# Patient Record
Sex: Female | Born: 1970 | Race: White | Hispanic: No | Marital: Single | Smoking: Never smoker
Health system: Southern US, Community
[De-identification: ages and names within clinical notes are randomized; demographics above are authoritative.]

## PROBLEM LIST (undated history)

## (undated) DIAGNOSIS — M3500 Sicca syndrome, unspecified: Secondary | ICD-10-CM

## (undated) DIAGNOSIS — K219 Gastro-esophageal reflux disease without esophagitis: Secondary | ICD-10-CM

## (undated) DIAGNOSIS — C801 Malignant (primary) neoplasm, unspecified: Secondary | ICD-10-CM

## (undated) DIAGNOSIS — M069 Rheumatoid arthritis, unspecified: Secondary | ICD-10-CM

## (undated) DIAGNOSIS — E079 Disorder of thyroid, unspecified: Secondary | ICD-10-CM

## (undated) DIAGNOSIS — I10 Essential (primary) hypertension: Secondary | ICD-10-CM

## (undated) DIAGNOSIS — K900 Celiac disease: Secondary | ICD-10-CM

## (undated) HISTORY — PX: PELVIC LYMPH NODE DISSECTION: SHX6543

## (undated) HISTORY — PX: ABDOMINAL HYSTERECTOMY: SHX81

---

## 2006-02-20 ENCOUNTER — Encounter (INDEPENDENT_AMBULATORY_CARE_PROVIDER_SITE_OTHER): Payer: Self-pay | Admitting: Family Medicine

## 2006-07-19 ENCOUNTER — Emergency Department (HOSPITAL_COMMUNITY): Admission: EM | Admit: 2006-07-19 | Discharge: 2006-07-19 | Payer: Self-pay | Admitting: Emergency Medicine

## 2007-02-05 ENCOUNTER — Emergency Department (HOSPITAL_COMMUNITY): Admission: EM | Admit: 2007-02-05 | Discharge: 2007-02-05 | Payer: Self-pay | Admitting: Emergency Medicine

## 2007-02-21 ENCOUNTER — Encounter (INDEPENDENT_AMBULATORY_CARE_PROVIDER_SITE_OTHER): Payer: Self-pay | Admitting: Family Medicine

## 2007-05-13 ENCOUNTER — Other Ambulatory Visit: Admission: RE | Admit: 2007-05-13 | Discharge: 2007-05-13 | Payer: Self-pay | Admitting: Obstetrics and Gynecology

## 2007-05-13 ENCOUNTER — Encounter (INDEPENDENT_AMBULATORY_CARE_PROVIDER_SITE_OTHER): Payer: Self-pay | Admitting: Family Medicine

## 2007-05-16 ENCOUNTER — Ambulatory Visit (HOSPITAL_COMMUNITY): Admission: RE | Admit: 2007-05-16 | Discharge: 2007-05-16 | Payer: Self-pay | Admitting: Obstetrics & Gynecology

## 2007-05-19 ENCOUNTER — Ambulatory Visit: Payer: Self-pay | Admitting: Family Medicine

## 2007-05-19 DIAGNOSIS — M129 Arthropathy, unspecified: Secondary | ICD-10-CM | POA: Insufficient documentation

## 2007-05-19 DIAGNOSIS — K5900 Constipation, unspecified: Secondary | ICD-10-CM | POA: Insufficient documentation

## 2007-05-19 DIAGNOSIS — R599 Enlarged lymph nodes, unspecified: Secondary | ICD-10-CM | POA: Insufficient documentation

## 2007-05-19 DIAGNOSIS — R799 Abnormal finding of blood chemistry, unspecified: Secondary | ICD-10-CM | POA: Insufficient documentation

## 2007-05-19 DIAGNOSIS — E785 Hyperlipidemia, unspecified: Secondary | ICD-10-CM

## 2007-05-19 DIAGNOSIS — F329 Major depressive disorder, single episode, unspecified: Secondary | ICD-10-CM | POA: Insufficient documentation

## 2007-05-19 DIAGNOSIS — K219 Gastro-esophageal reflux disease without esophagitis: Secondary | ICD-10-CM | POA: Insufficient documentation

## 2007-05-19 DIAGNOSIS — R5383 Other fatigue: Secondary | ICD-10-CM

## 2007-05-19 DIAGNOSIS — N63 Unspecified lump in unspecified breast: Secondary | ICD-10-CM | POA: Insufficient documentation

## 2007-05-19 DIAGNOSIS — G43909 Migraine, unspecified, not intractable, without status migrainosus: Secondary | ICD-10-CM | POA: Insufficient documentation

## 2007-05-19 DIAGNOSIS — R5381 Other malaise: Secondary | ICD-10-CM | POA: Insufficient documentation

## 2007-05-21 ENCOUNTER — Other Ambulatory Visit: Admission: RE | Admit: 2007-05-21 | Discharge: 2007-05-21 | Payer: Self-pay | Admitting: Obstetrics & Gynecology

## 2007-05-21 ENCOUNTER — Encounter (INDEPENDENT_AMBULATORY_CARE_PROVIDER_SITE_OTHER): Payer: Self-pay | Admitting: Family Medicine

## 2007-05-21 ENCOUNTER — Ambulatory Visit (HOSPITAL_COMMUNITY): Admission: RE | Admit: 2007-05-21 | Discharge: 2007-05-21 | Payer: Self-pay | Admitting: Obstetrics & Gynecology

## 2007-05-22 LAB — CONVERTED CEMR LAB
Albumin: 4.6 g/dL (ref 3.5–5.2)
Basophils Relative: 1 % (ref 0–1)
Calcium: 9.5 mg/dL (ref 8.4–10.5)
Cholesterol: 178 mg/dL (ref 0–200)
Creatinine, Ser: 0.81 mg/dL (ref 0.40–1.20)
Eosinophils Absolute: 0.1 10*3/uL (ref 0.0–0.7)
Glucose, Bld: 91 mg/dL (ref 70–99)
HDL: 41 mg/dL (ref >39)
Hemoglobin: 14.1 g/dL (ref 12.0–15.0)
Lymphocytes Relative: 30 % (ref 12–46)
Lymphs Abs: 1.5 10*3/uL (ref 0.7–3.3)
Monocytes Absolute: 0.5 10*3/uL (ref 0.2–0.7)
Neutro Abs: 3 10*3/uL (ref 1.7–7.7)
Neutrophils Relative %: 58 % (ref 43–77)
RDW: 14.1 % — ABNORMAL HIGH (ref 11.5–14.0)
Rhuematoid fact SerPl-aCnc: 23 intl units/mL — ABNORMAL HIGH (ref 0–20)
Total CHOL/HDL Ratio: 4.3
Total Protein: 8.2 g/dL (ref 6.0–8.3)
Triglycerides: 170 mg/dL — ABNORMAL HIGH (ref <150)
WBC: 5.1 10*3/uL (ref 4.0–10.5)

## 2007-05-23 ENCOUNTER — Encounter (INDEPENDENT_AMBULATORY_CARE_PROVIDER_SITE_OTHER): Payer: Self-pay | Admitting: Family Medicine

## 2007-05-26 ENCOUNTER — Encounter (INDEPENDENT_AMBULATORY_CARE_PROVIDER_SITE_OTHER): Payer: Self-pay | Admitting: Family Medicine

## 2007-05-26 ENCOUNTER — Telehealth (INDEPENDENT_AMBULATORY_CARE_PROVIDER_SITE_OTHER): Payer: Self-pay | Admitting: *Deleted

## 2007-05-27 ENCOUNTER — Ambulatory Visit (HOSPITAL_COMMUNITY): Admission: RE | Admit: 2007-05-27 | Discharge: 2007-05-27 | Payer: Self-pay | Admitting: Family Medicine

## 2007-06-02 ENCOUNTER — Ambulatory Visit: Payer: Self-pay | Admitting: Family Medicine

## 2007-06-02 DIAGNOSIS — IMO0001 Reserved for inherently not codable concepts without codable children: Secondary | ICD-10-CM

## 2007-06-02 DIAGNOSIS — K117 Disturbances of salivary secretion: Secondary | ICD-10-CM

## 2007-06-02 LAB — CONVERTED CEMR LAB
Cholesterol, target level: 200 mg/dL
LDL Goal: 160 mg/dL

## 2007-06-03 ENCOUNTER — Telehealth (INDEPENDENT_AMBULATORY_CARE_PROVIDER_SITE_OTHER): Payer: Self-pay | Admitting: *Deleted

## 2007-06-03 ENCOUNTER — Encounter (INDEPENDENT_AMBULATORY_CARE_PROVIDER_SITE_OTHER): Payer: Self-pay | Admitting: Family Medicine

## 2007-06-03 DIAGNOSIS — K149 Disease of tongue, unspecified: Secondary | ICD-10-CM | POA: Insufficient documentation

## 2007-06-04 ENCOUNTER — Encounter (INDEPENDENT_AMBULATORY_CARE_PROVIDER_SITE_OTHER): Payer: Self-pay | Admitting: Family Medicine

## 2007-06-11 ENCOUNTER — Ambulatory Visit (HOSPITAL_COMMUNITY): Admission: RE | Admit: 2007-06-11 | Discharge: 2007-06-11 | Payer: Self-pay | Admitting: Obstetrics & Gynecology

## 2007-06-11 ENCOUNTER — Encounter: Payer: Self-pay | Admitting: Obstetrics & Gynecology

## 2007-07-16 ENCOUNTER — Ambulatory Visit: Admission: RE | Admit: 2007-07-16 | Discharge: 2007-07-16 | Payer: Self-pay | Admitting: Gynecologic Oncology

## 2007-08-23 ENCOUNTER — Emergency Department (HOSPITAL_COMMUNITY): Admission: EM | Admit: 2007-08-23 | Discharge: 2007-08-23 | Payer: Self-pay | Admitting: Emergency Medicine

## 2007-08-24 ENCOUNTER — Encounter (INDEPENDENT_AMBULATORY_CARE_PROVIDER_SITE_OTHER): Payer: Self-pay | Admitting: Family Medicine

## 2007-08-24 ENCOUNTER — Ambulatory Visit (HOSPITAL_COMMUNITY): Admission: RE | Admit: 2007-08-24 | Discharge: 2007-08-24 | Payer: Self-pay | Admitting: Emergency Medicine

## 2007-08-29 ENCOUNTER — Emergency Department (HOSPITAL_COMMUNITY): Admission: EM | Admit: 2007-08-29 | Discharge: 2007-08-29 | Payer: Self-pay | Admitting: Emergency Medicine

## 2007-08-30 ENCOUNTER — Encounter (INDEPENDENT_AMBULATORY_CARE_PROVIDER_SITE_OTHER): Payer: Self-pay | Admitting: Family Medicine

## 2007-09-27 ENCOUNTER — Emergency Department (HOSPITAL_COMMUNITY): Admission: EM | Admit: 2007-09-27 | Discharge: 2007-09-27 | Payer: Self-pay | Admitting: Emergency Medicine

## 2007-10-01 ENCOUNTER — Ambulatory Visit (HOSPITAL_COMMUNITY): Admission: RE | Admit: 2007-10-01 | Discharge: 2007-10-01 | Payer: Self-pay | Admitting: Gastroenterology

## 2007-10-01 ENCOUNTER — Ambulatory Visit: Payer: Self-pay | Admitting: Gastroenterology

## 2007-11-24 ENCOUNTER — Ambulatory Visit (HOSPITAL_COMMUNITY): Admission: RE | Admit: 2007-11-24 | Discharge: 2007-11-24 | Payer: Self-pay | Admitting: Obstetrics and Gynecology

## 2008-05-15 ENCOUNTER — Emergency Department (HOSPITAL_COMMUNITY): Admission: EM | Admit: 2008-05-15 | Discharge: 2008-05-15 | Payer: Self-pay | Admitting: Emergency Medicine

## 2008-05-27 ENCOUNTER — Emergency Department (HOSPITAL_COMMUNITY): Admission: EM | Admit: 2008-05-27 | Discharge: 2008-05-27 | Payer: Self-pay | Admitting: Emergency Medicine

## 2008-06-24 ENCOUNTER — Encounter: Admission: RE | Admit: 2008-06-24 | Discharge: 2008-06-24 | Payer: Self-pay | Admitting: Family Medicine

## 2008-07-07 ENCOUNTER — Emergency Department (HOSPITAL_COMMUNITY): Admission: EM | Admit: 2008-07-07 | Discharge: 2008-07-07 | Payer: Self-pay | Admitting: Emergency Medicine

## 2008-09-21 ENCOUNTER — Other Ambulatory Visit: Admission: RE | Admit: 2008-09-21 | Discharge: 2008-09-21 | Payer: Self-pay | Admitting: Gynecologic Oncology

## 2008-09-21 ENCOUNTER — Ambulatory Visit: Admission: RE | Admit: 2008-09-21 | Discharge: 2008-09-21 | Payer: Self-pay | Admitting: Gynecologic Oncology

## 2008-09-21 ENCOUNTER — Encounter: Payer: Self-pay | Admitting: Gynecologic Oncology

## 2008-09-21 ENCOUNTER — Ambulatory Visit (HOSPITAL_COMMUNITY): Admission: RE | Admit: 2008-09-21 | Discharge: 2008-09-21 | Payer: Self-pay | Admitting: Gynecologic Oncology

## 2008-10-05 IMAGING — US US BREAST BILAT
1 series · 9 of 9 positions shown · non-contrast
Comparison: none

BILATERAL DIAGNOSTIC MAMMOGRAM

BILATERAL BREAST ULTRASOUND
BILATERAL DIAGNOSTIC MAMMOGRAM AND BILATERAL BREAST ULTRASOUND:
CLINICAL DATA: There is a dense fibroglandular pattern.  The right breast is negative.  Spot tangential view of 
the area of clinical concern reveals normal tissue.  In the medial aspect of the left breast there 
is an obscured mass that persists on the spot compression views.

[Series 1: us breast bilat · 0.09mm/px · 9 of 9 slices shown]
[im 1/9]
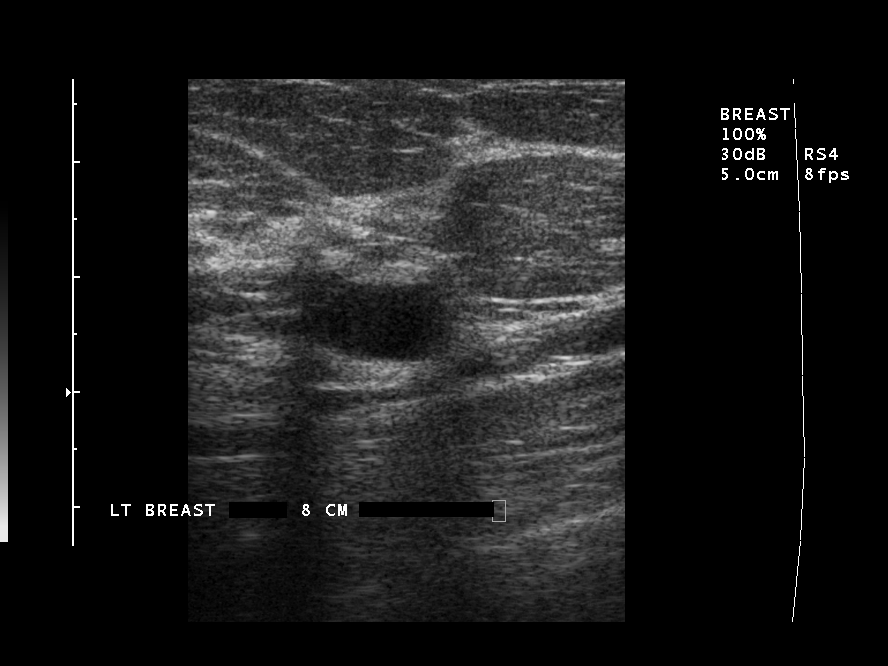
[im 2/9]
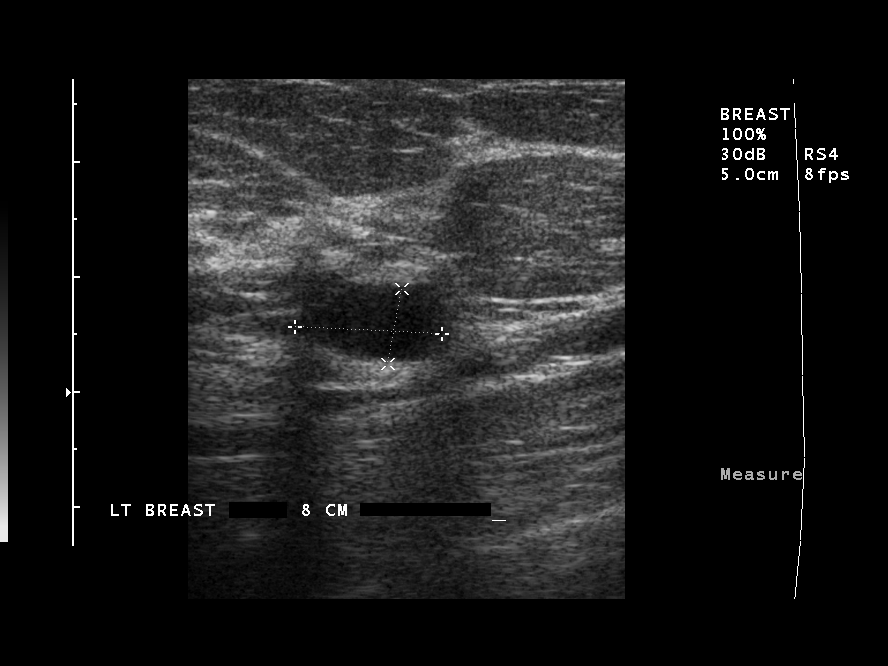
[im 3/9]
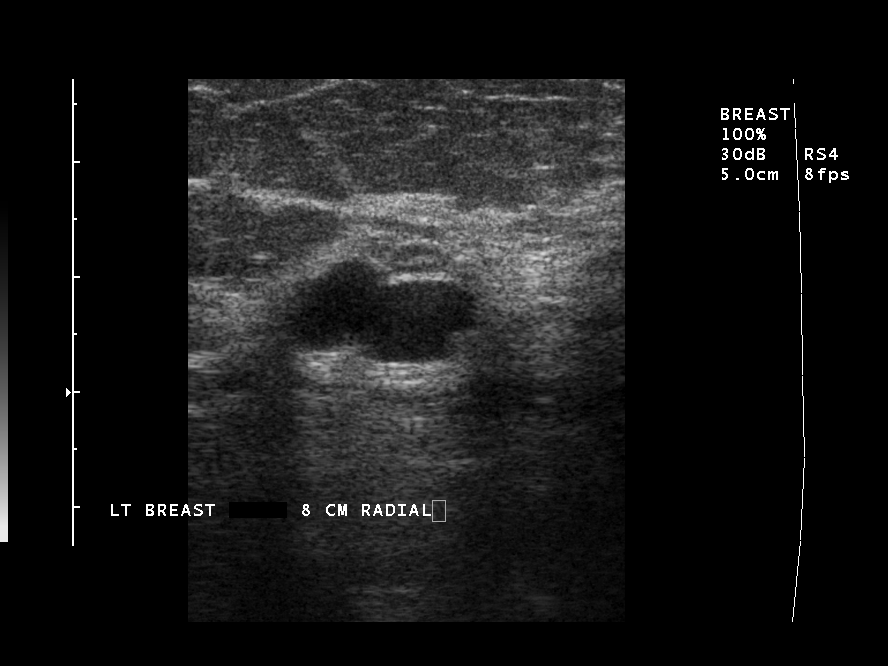
[im 4/9]
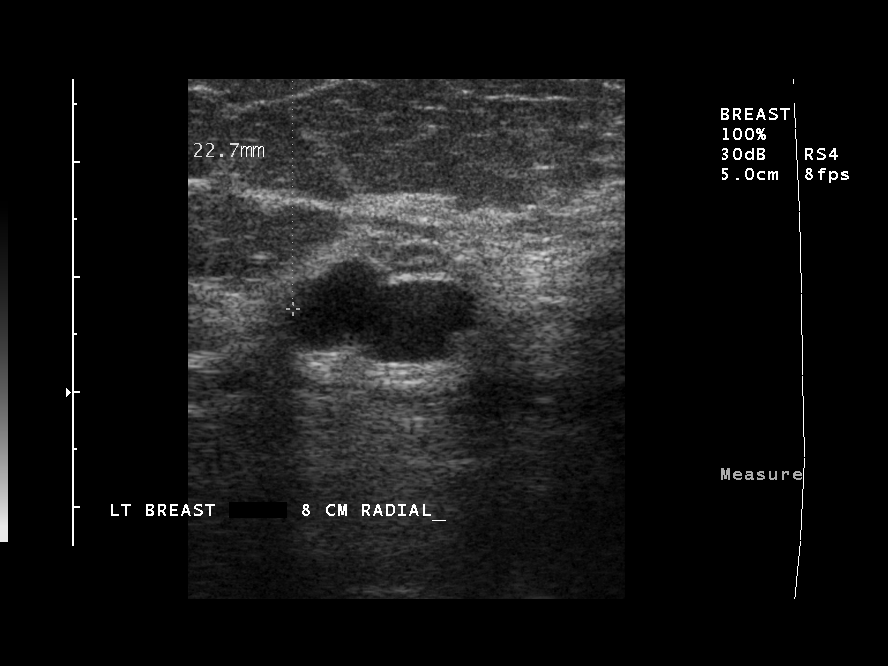
[im 5/9]
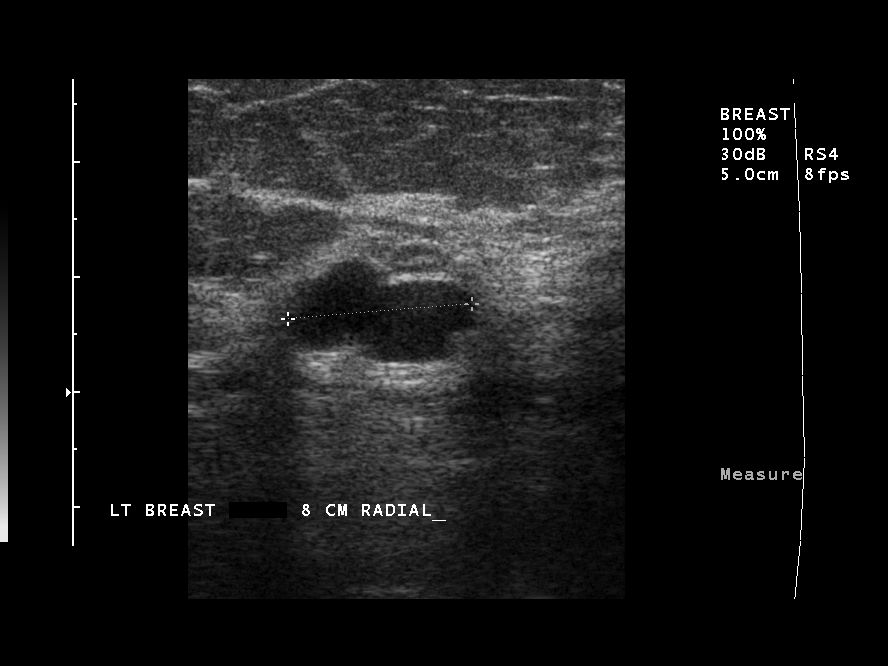
[im 6/9]
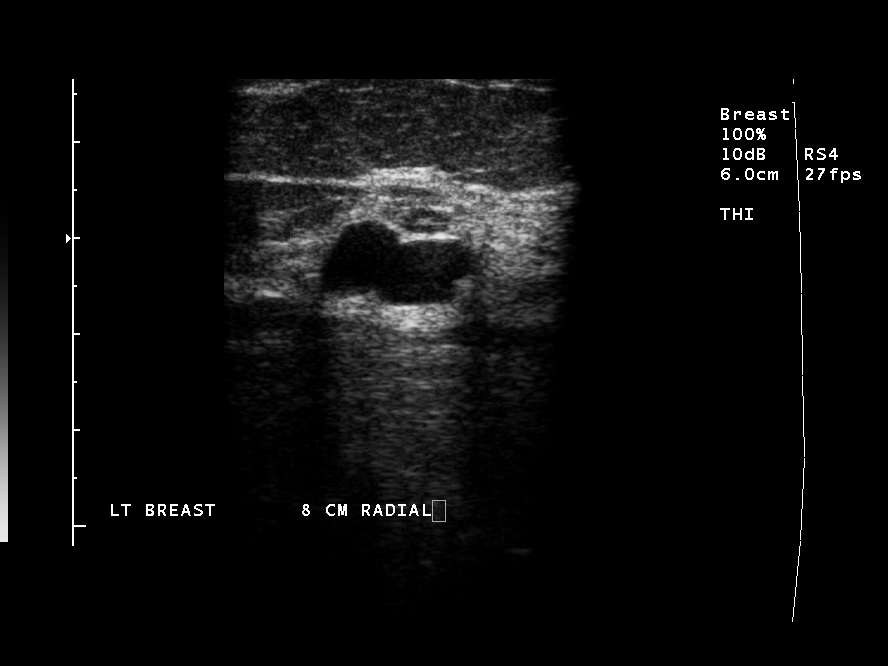
[im 7/9]
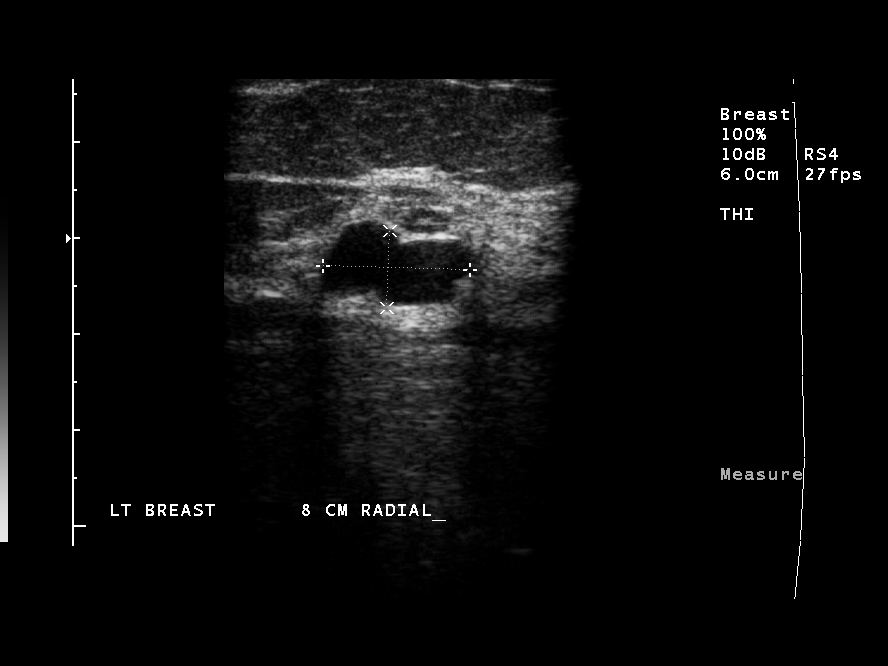
[im 8/9]
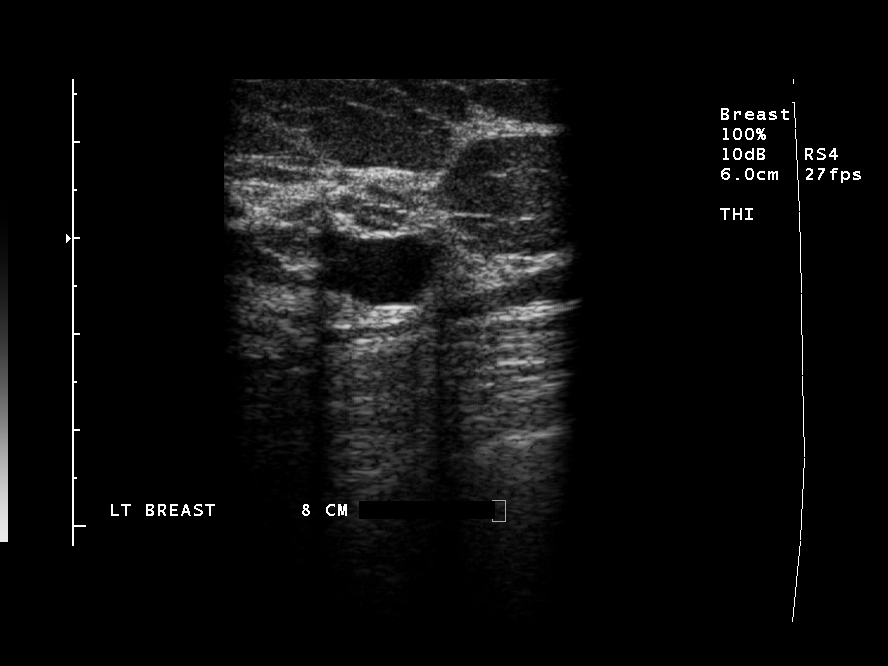
[im 9/9]
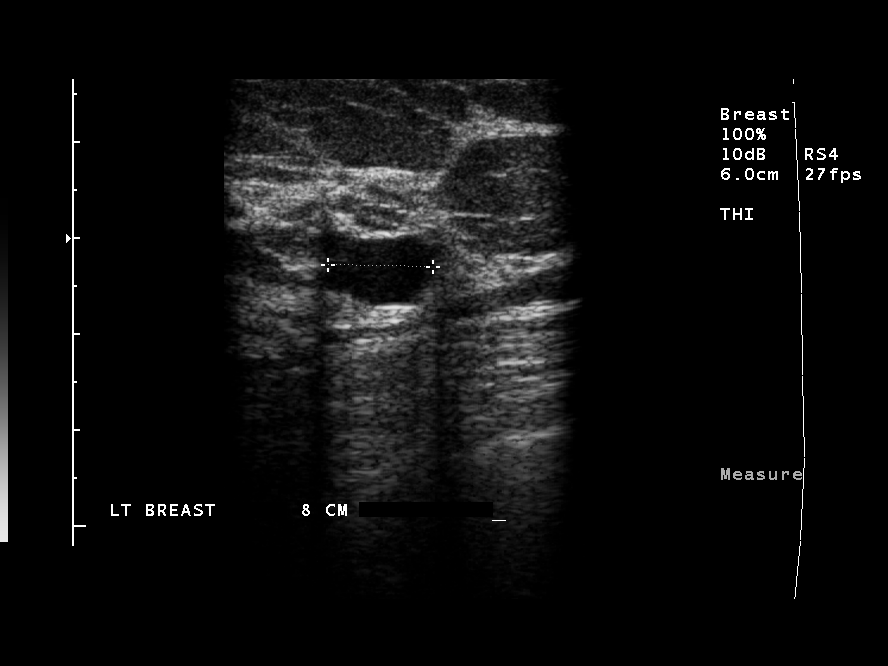

[9 of 9 positions shown; findings below may reference images not displayed]

On physical exam, I do not palpate a mass in the right breast.  Sonographic evaluation of the area 
of clinical concern in the upper inner quadrant reveals normal fibroglandular tissue.  There is no 
solid or cystic mass, abnormal shadowing or distortion.  Sonographic evaluation also of what the 
patient describes is an area of thickening in the lower outer quadrant reveals normal 
fibroglandular tissue.  There is no solid or cystic mass, abnormal shadowing or distortion.

On physical exam, I do not palpate a mass in the left breast.  Sonographically, there is a near 
anechoic mass at 10 o'clock approximately 8 cm from the nipple measuring 13 x 7 x 16 mm.  It is 
felt to likely represent a benign cyst.
IMPRESSION: Probable benign cyst, left breast.  Short term interval follow-up  ultrasound in six months is 
recommended.

ASSESSMENT: Probably benign - BI-RADS 3

Ultrasound of the left breast in 6 months.
,

## 2009-01-08 IMAGING — US US EXTREM LOW VENOUS*L*
1 series · 14 of 24 positions shown · non-contrast
Comparison: No comparison.

CLINICAL DATA: Pain and swelling. 
 LEFT LOWER EXTREMITY VENOUS DOPPLER ULTRASOUND:
TECHNIQUE: Gray-scale sonography with compression, as well as color and duplex Doppler ultrasound, were performed to evaluate the deep venous system from the level of the common femoral vein through the popliteal and proximal calf veins.

[Series 1: unknown · 14 of 33 slices shown]
[im 1/33]
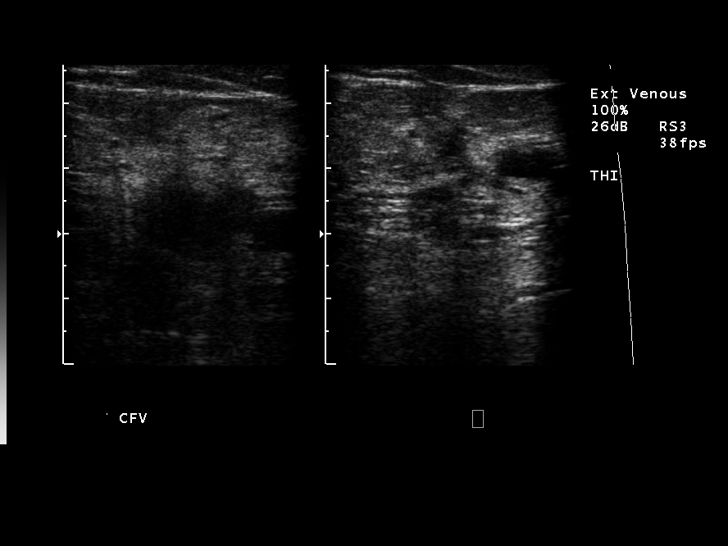
[im 3/33]
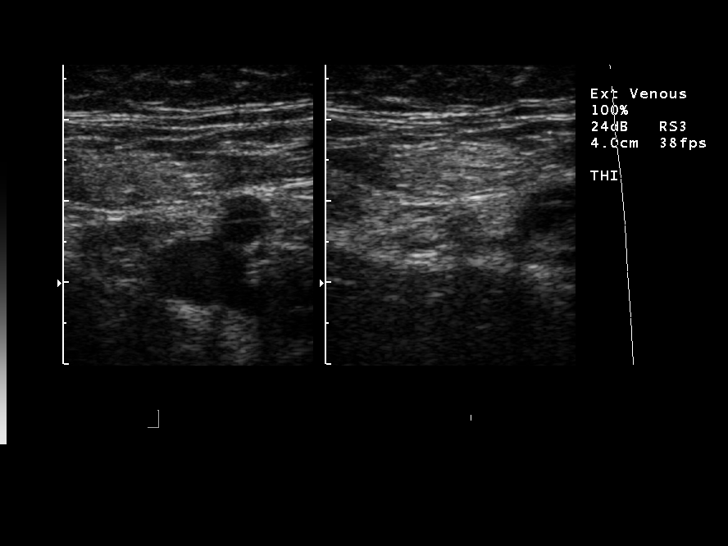
[im 6/33]
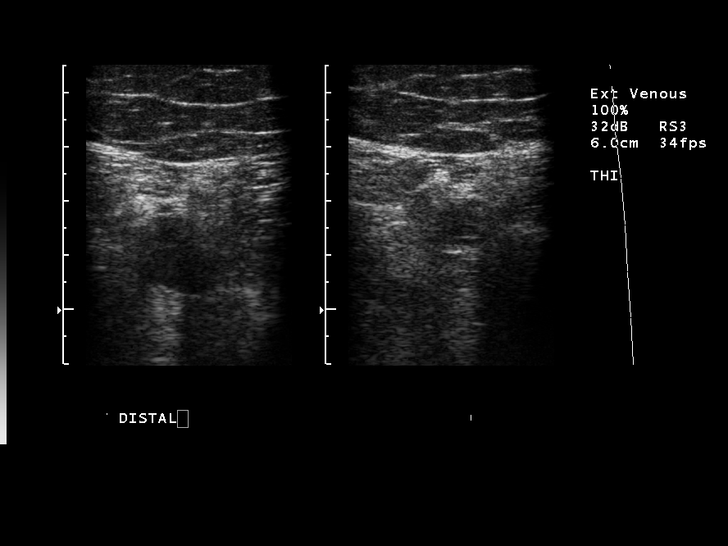
[im 9/33]
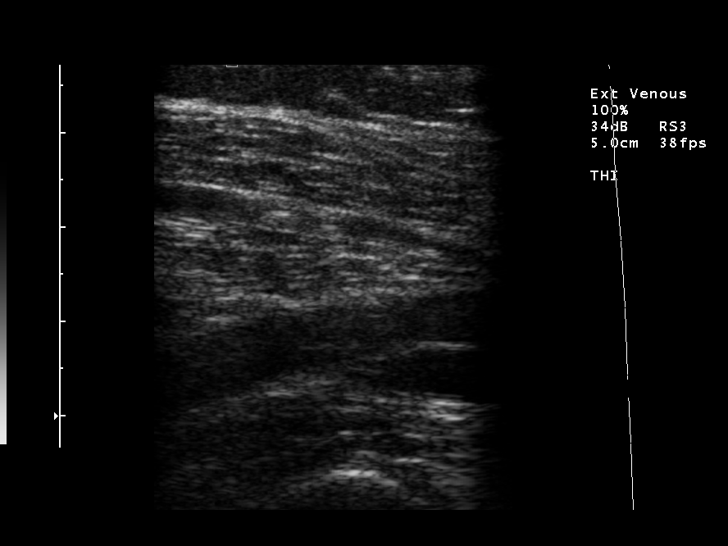
[im 10/33]
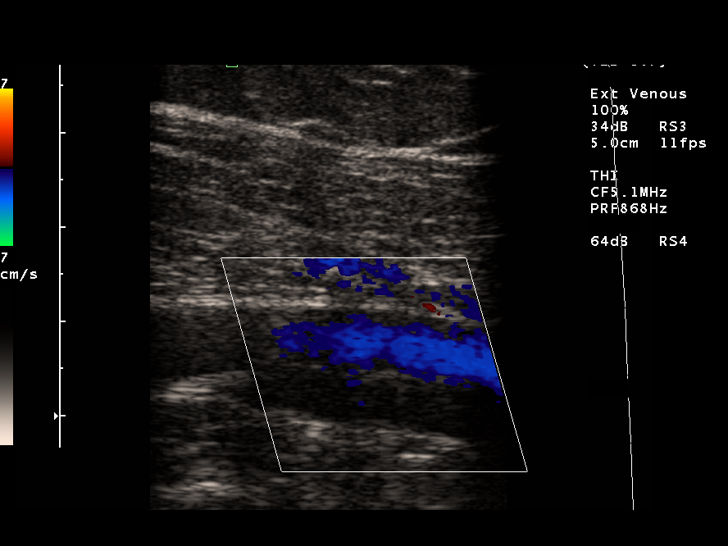
[im 13/33]
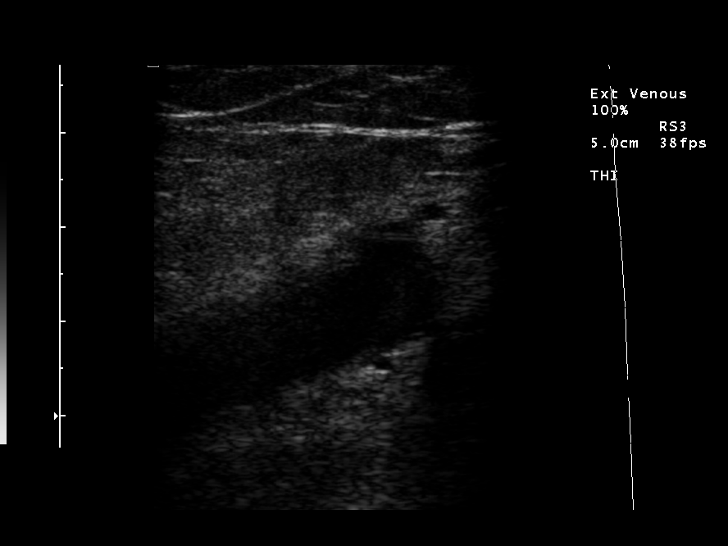
[im 16/33]
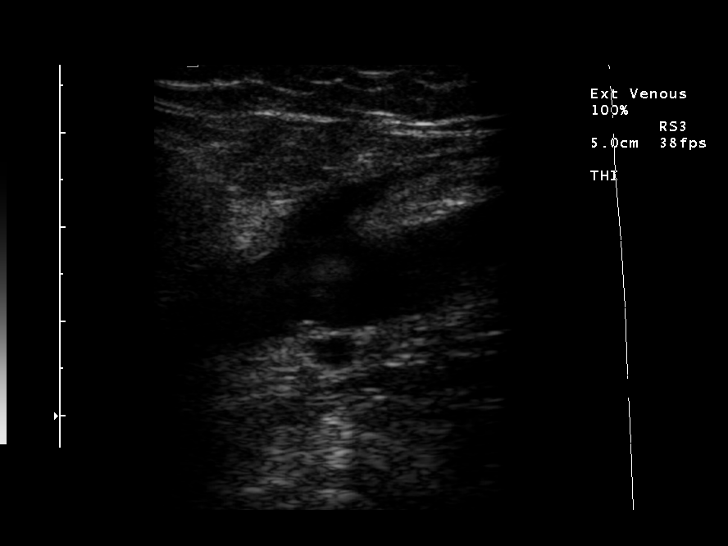
[im 17/33]
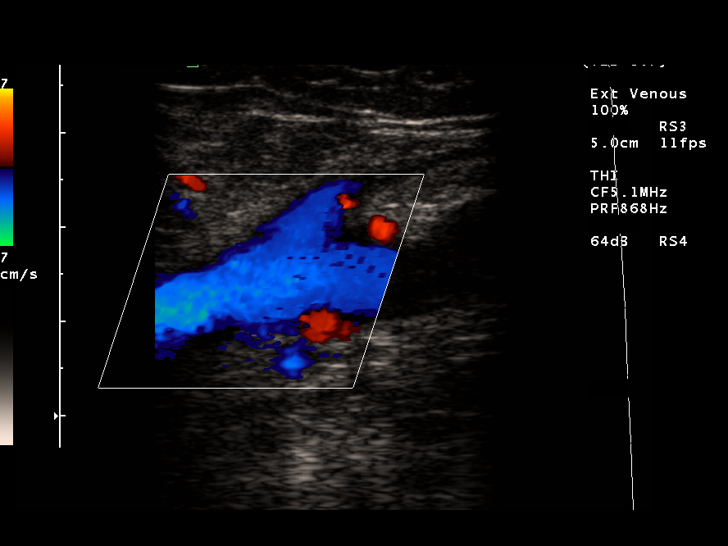
[im 20/33]
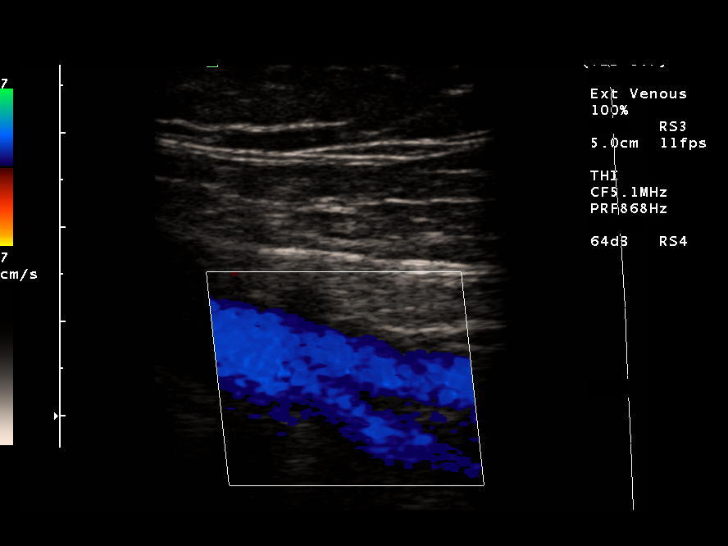
[im 23/33]
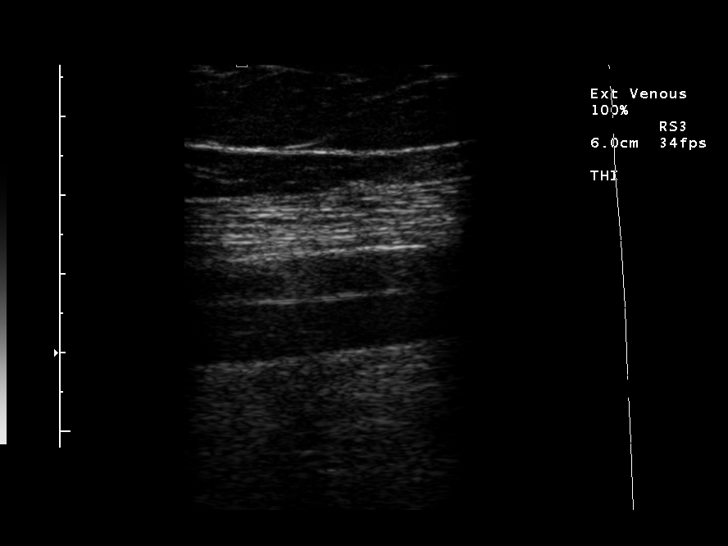
[im 26/33]
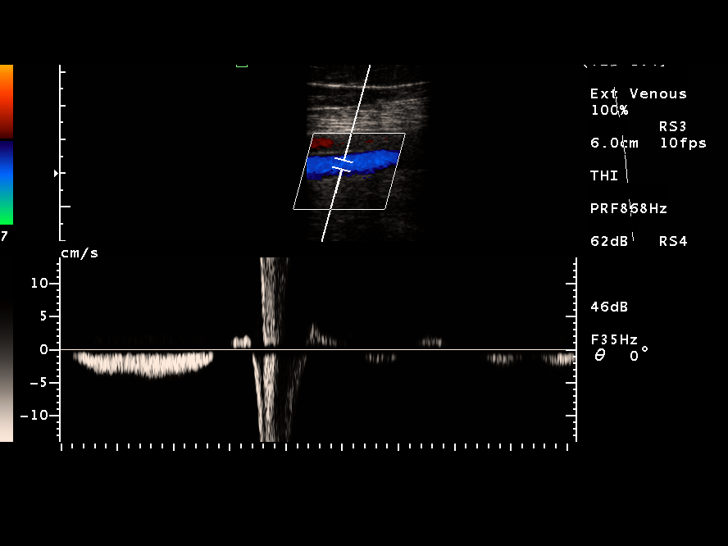
[im 27/33]
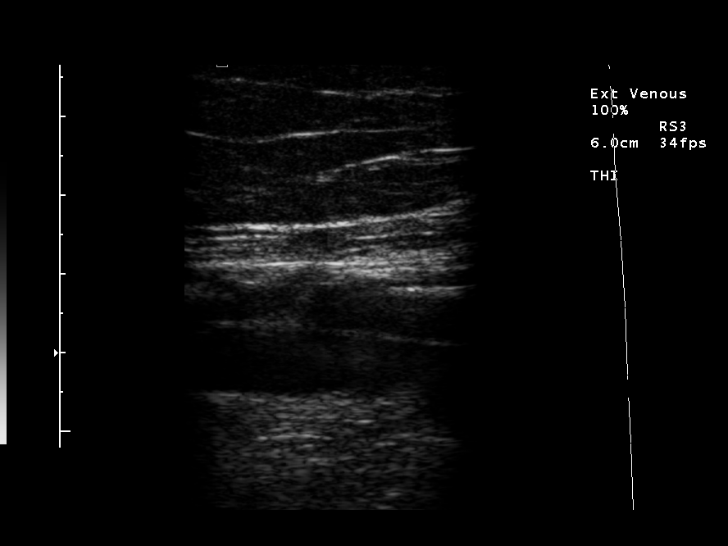
[im 30/33]
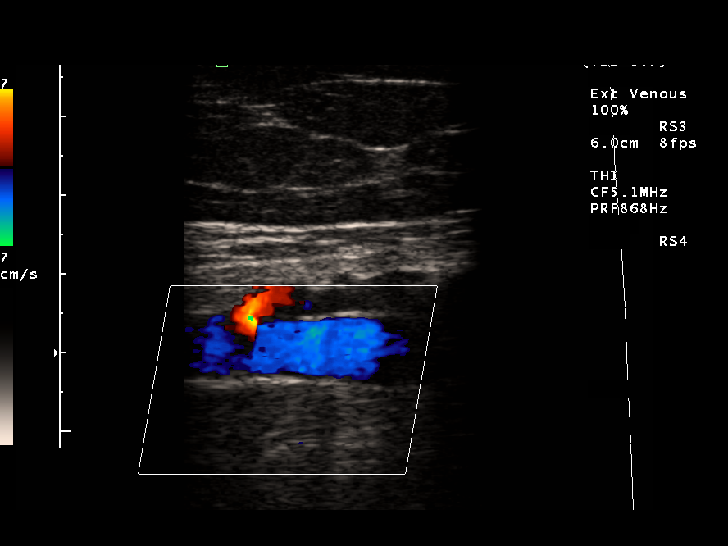
[im 33/33]
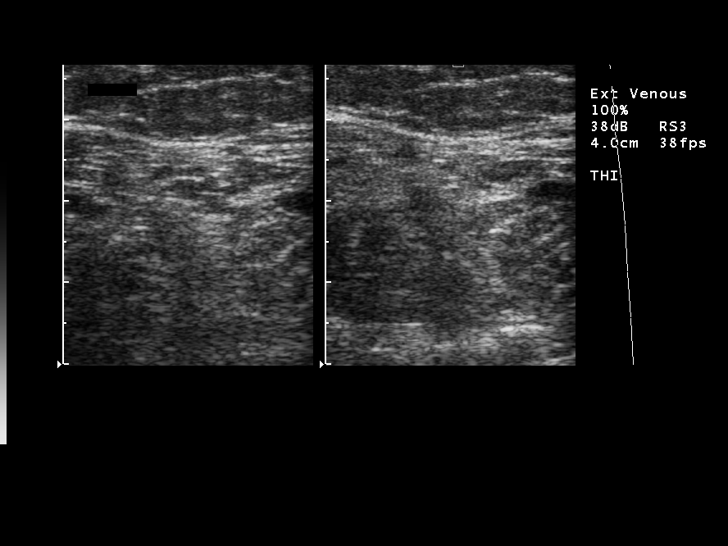

[14 of 24 positions shown; findings below may reference images not displayed]

FINDINGS: Complete compressibility is demonstrated throughout the visualized deep veins.  No venous filling defects are identified by gray-scale or color Doppler sonography.  Doppler waveforms show normal direction of venous flow, with normal phasicity and response to augmentation.
IMPRESSION: Negative.  No evidence of deep venous thrombosis.

## 2009-07-08 ENCOUNTER — Emergency Department (HOSPITAL_COMMUNITY): Admission: EM | Admit: 2009-07-08 | Discharge: 2009-07-08 | Payer: Self-pay | Admitting: Emergency Medicine

## 2009-08-19 ENCOUNTER — Emergency Department (HOSPITAL_COMMUNITY): Admission: EM | Admit: 2009-08-19 | Discharge: 2009-08-19 | Payer: Self-pay | Admitting: Emergency Medicine

## 2009-10-19 ENCOUNTER — Ambulatory Visit (HOSPITAL_COMMUNITY): Admission: RE | Admit: 2009-10-19 | Discharge: 2009-10-19 | Payer: Self-pay | Admitting: Internal Medicine

## 2009-10-24 ENCOUNTER — Ambulatory Visit (HOSPITAL_COMMUNITY): Admission: RE | Admit: 2009-10-24 | Discharge: 2009-10-24 | Payer: Self-pay | Admitting: Internal Medicine

## 2010-09-06 ENCOUNTER — Ambulatory Visit
Admission: RE | Admit: 2010-09-06 | Discharge: 2010-09-06 | Payer: Self-pay | Source: Home / Self Care | Attending: Family Medicine | Admitting: Family Medicine

## 2010-09-17 ENCOUNTER — Encounter: Payer: Self-pay | Admitting: Family Medicine

## 2010-11-23 IMAGING — CR DG CHEST 2V
2 series · 2 of 2 positions shown · non-contrast
Comparison: 09/21/2008

CLINICAL DATA: Sore throat, cough and congestion.

CHEST - 2 VIEW

[view not recorded (1 of 2)]
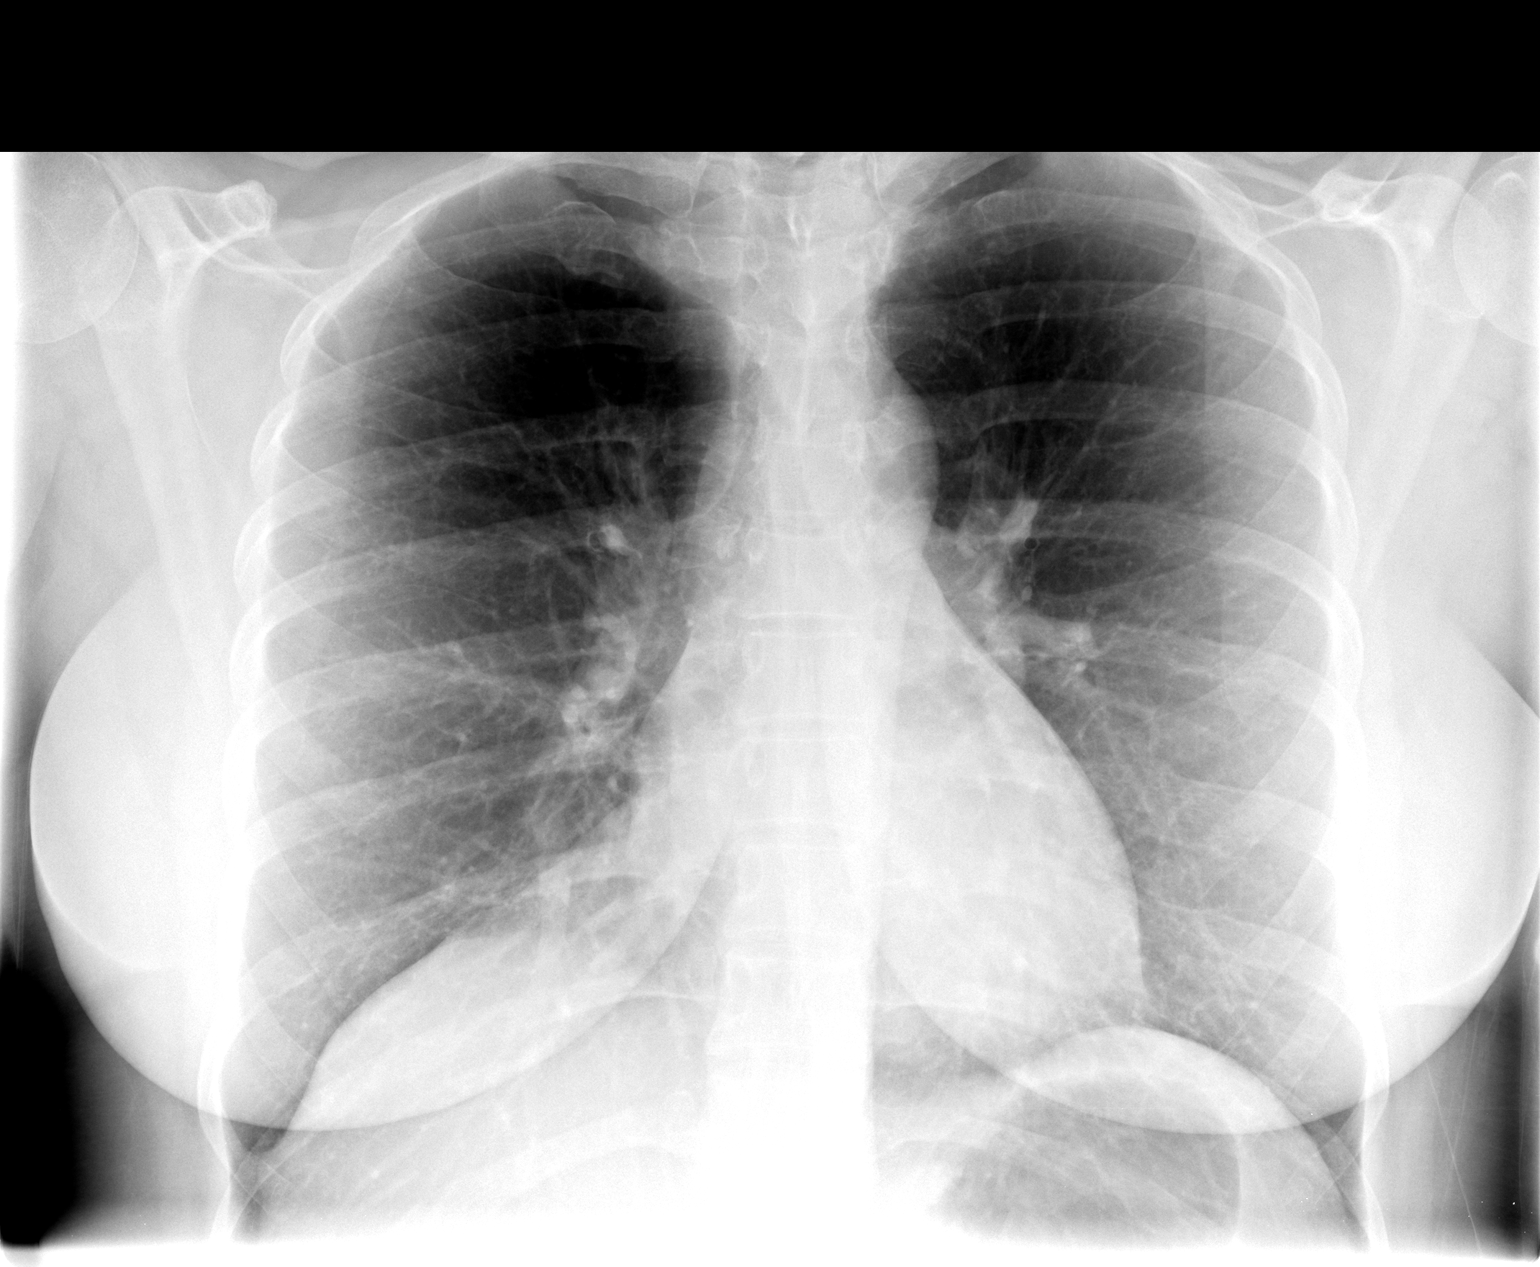

[view not recorded (2 of 2)]
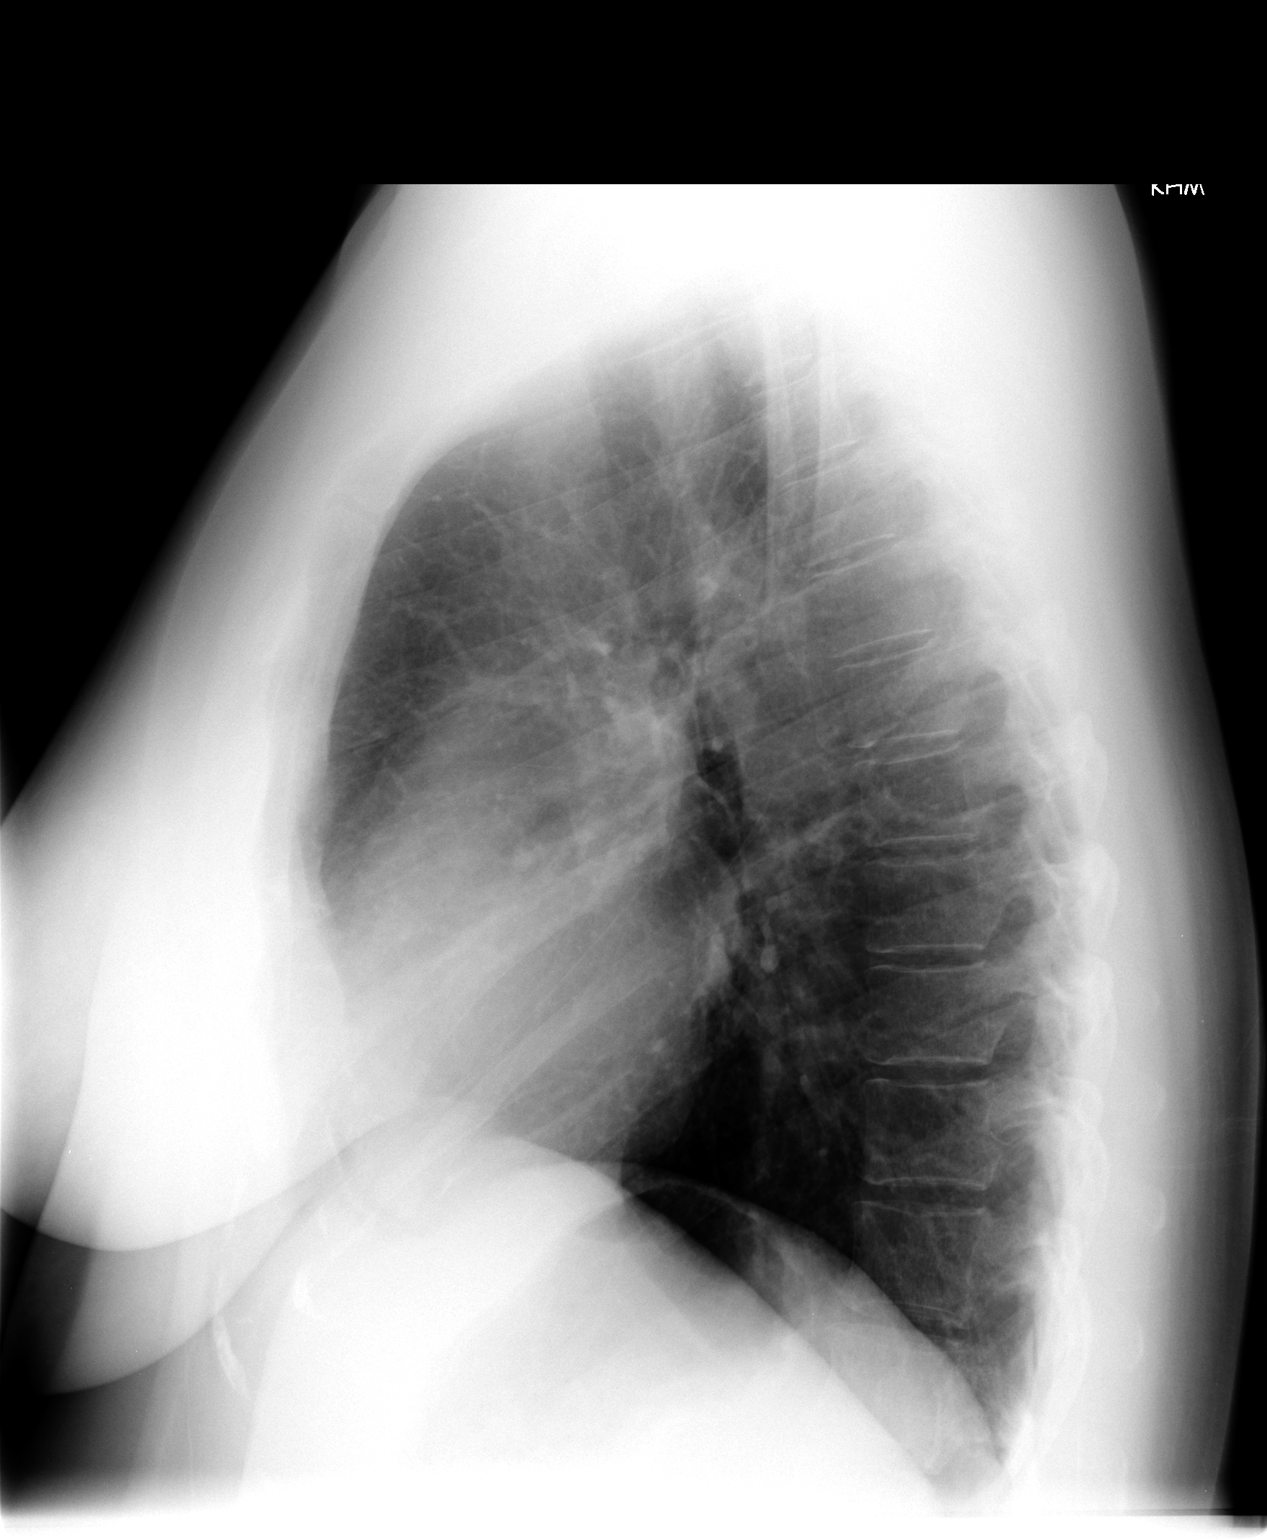

[2 of 2 positions shown; findings below may reference images not displayed]

FINDINGS: The cardiac silhouette, mediastinal and hilar contours
are within normal limits and stable. The lungs are clear.  No
pleural effusions. The bony thorax is intact.
IMPRESSION: No acute cardiopulmonary findings.

## 2010-11-29 LAB — RAPID STREP SCREEN (MED CTR MEBANE ONLY): Streptococcus, Group A Screen (Direct): NEGATIVE

## 2011-01-09 NOTE — Consult Note (Signed)
Alejandra Cole, Alejandra Cole        ACCOUNT NO.:  [id number]   MEDICAL RECORD NO.:  [id number]          PATIENT TYPE:  OUT   LOCATION:  GYN                          FACILITY:  [facility]   PHYSICIAN:  [name], MD    DATE OF BIRTH:  [birth year]   DATE OF CONSULTATION:  09/21/2008  DATE OF DISCHARGE:                                 CONSULTATION   The patient is a 40 year old with history of 1A2 adenocarcinoma of the  cervix who underwent surgical staging December 2008.  She had 0/40 lymph  nodes positive.  She had no residual carcinoma on her hysterectomy  specimen.  She did undergo bilateral oophoropexy.  She was seen by Dr.  Annett Fabian in [place] in July 2009.  Pap smear at that time was  unremarkable.  She comes in today for followup.  She is overall doing  fairly well.  She is complaining of some mid pelvic pain.  She states  that a bowel movement can make the pain come up and it does not awaken  her at night.  It has been about the same and has been fairly  persistent, though it is not exacerbating and seems to be fairly  intermittent with no pattern.  She denies any vaginal bleeding.  She  occasionally has a thin discharge with no itching or burning.  She  states that she might have a diagnosis of fibromyalgia and she takes  ibuprofen for total body pain.  If she has a headache, she takes aspirin  and Tylenol fairly regularly.  She did have an episode where she had a  boil on her left leg that was drained.  She has also developed a rash on  her anterior lower leg.  She is being given steroids for what sounds  like an emergency room visit that really did not make a significant  difference.  There is some pruritus associated with that rash.  She  states that she has had it since her surgery but does not recall exactly  when after the surgery it arose.  She does state that she may have some  lymphedema and she complains of some mild edema around her lateral  malleolus, left  greater than right.   HEALTH MAINTENANCE:  She is up-to-date on her mammograms.   MEDICATIONS:  Ibuprofen and Tylenol as stated above.   PHYSICAL EXAMINATION:  VITAL SIGNS:  Blood pressure 122/74, pulse 72,  respirations 20.  CONSTITUTIONAL:  Well-nourished, alert female in no acute distress.  NECK:  Supple with no lymphadenopathy, no thyromegaly.  LUNGS:  Clear to auscultation bilaterally.  CARDIOVASCULAR:  Exam regular rate and rhythm.  ABDOMEN:  Shows well-healed robotic skin incisions.  Abdomen is soft,  nontender, nondistended.  No palpable masses or past thyromegaly.  Groins are negative for adenopathy.  EXTREMITIES:  There is no edema.  She does have a patch of maculopapular  rash with some small amount of excoriations on the anterior aspect of  her left lower leg.  There is no surrounding erythema.  There is no  induration.  I cannot appreciate any lymphedema.  PELVIC:  External genitalia is  within normal limits.  The vagina is well  epithelialized.  The vaginal cuff is visualized.  No visible lesions.  ThinPrep Pap was submitted without difficulty.  Bimanual examination  reveals no masses or nodularity.  Rectal confirms.   ASSESSMENT:  A 40 year old 1A2 adenocarcinoma of the cervix who  clinically has no evidence of recurrent disease.  She is 1 year out.   PLAN:  1. Follow results of her Pap smear from today.  Will obtain a chest x-      ray.  2. She will return to see [place] in 4 months.      [name], MD  Electronically Signed     PAG/MEDQ  D:  09/21/2008  T:  09/21/2008  Job:  [id number]   cc:   Lazaro Arms, M.D.  Fax: [phone]   Telford Nab, R.N.  [address]  [place], [place] [address]

## 2011-01-09 NOTE — Op Note (Signed)
[name], Alejandra Cole        ACCOUNT NO.:  [id number]   MEDICAL RECORD NO.:  [id number]          PATIENT TYPE:  AMB   LOCATION:  DAY                           FACILITY:  [facility]   PHYSICIAN:  Lazaro Arms, M.D.   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   PROCEDURE:  Cold knife conization of the cervix.   SURGEON:  Lazaro Arms, MD.   ANESTHESIA:  General endotracheal.   FINDINGS:  The patient had a Pap smear, which revealed cells concerning  for invasive carcinoma.  We did a colposcopy and an aggressive biopsy,  but I was unable to get any underlying stroma.  She either has carcinoma  in situ or invasive cancer.  Today, the cold knife cone is done for  diagnostic reasons as well as potentially therapeutic reasons if she  indeed has carcinoma in situ and no invasive cancer.  At the time of  surgery, Lugol's was used and delineated the non-staining areas, and I  got good margin around that.  My guess is that this is either carcinoma  in situ or microinvasive because I certainly did not feel as if it went  up the cervical canal with the firmness of the tissue.   DESCRIPTION OF PROCEDURE:  The patient was taken to the operating room  and placed in the supine position where she underwent general  endotracheal anesthesia.  She was placed in candy cane stirrups, prepped  and draped in the usual sterile fashion.  Her bladder was drained.  Her  cervix was grasped, Lugol's was used, and the cervix was painted.  The  non-staining area was identified.  A #11 blade was used, and a good  margin around the non-staining areas obtained circumferentially.  It was  taken to a depth conically down into the cervical canal. There was  one  area at about 3 o'clock to 6 o'clock that I thought I got a little bit  too close so I did an extra specimen over there more laterally.  If that  margin is positive in the main specimen but negative on the subsequent  specimen then I will feel confidant that we got everything on that 3  o'clock to 6 o'clock area.  Hemostasis was achieved using electrocautery  as well as Monsel's.  The patient tolerated the procedure well, she  experienced 150 ml of blood loss, taken to the recovery room in good  stable condition, all  counts were correct, she received Ancef  prophylactically.      Lazaro Arms, M.D.  Electronically Signed    LHE/MEDQ  D:  06/11/2007  T:  06/11/2007  Job:  [id number]

## 2011-01-09 NOTE — Consult Note (Signed)
Alejandra Cole, Alejandra Cole        ACCOUNT NO.:  [id number]   MEDICAL RECORD NO.:  [id number]          PATIENT TYPE:  OUT   LOCATION:  GYN                          FACILITY:  [facility]   PHYSICIAN:  [name], MD    DATE OF BIRTH:  Jan 04, 1971   DATE OF CONSULTATION:  07/16/2007  DATE OF DISCHARGE:                                 CONSULTATION   REFERRING PHYSICIAN:  Lazaro Arms, M.D.   Alejandra Cole is a 40 year old gravida 2, para 1, abortus 1 whose last  cycle was June 23, 2007.  She has a history of going back in  September for her annual examination.  Her last Pap prior had been 3  years although the results of which she did not know.  At the time of  the Pap smear on May 13, 2007, it was noted to reveal squamous  cell carcinoma.  Cervical biopsy on September 24th revealed high-grade  dysplasia and squamous cell.  She underwent a cold knife conization  June 11, 2007.  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  Maximal stromal invasion was 5 mm.  There was  no lymphovascular space involvement.  Both endocervical and ectocervical  margins were negative for dysplasia.  Cervical biopsies at 3 and 6  o'clock revealed benign squamous mucosa.  She was deemed to be a T1a2 NX  MX cervical carcinoma.   OPERATIVE FINDINGS:  At the time of her conization, it revealed a Lugol  non-staining region.  There was no gross visible lesion.  She has a  history of having of an abnormal Pap smear 5 years ago in [place].  It was recommended she have followup in 6 months.  Per her report, she  did not as she lost her insurance, had Pap smear at a free clinic 3  years ago and she does not know the results of which but assumed it was  normal.   Unfortunately, at that time, she subsequently had also moved to  [place] so they may have not been able to locate her.  She  otherwise has continued to have regular cycles.  She denies any change  of bowel/bladder habits.  She denies any pain.  She occasionally has  some headaches.  She is currently not sexually active and does not have  a partner.   PAST MEDICAL HISTORY:  Significant for questionable fibromyalgia.   PAST SURGICAL HISTORY:  1. Bilateral TMJ surgery about 5 years ago.  2. Cold knife conization.  3. She has had 1 spontaneous vaginal delivery.   MEDICATIONS:  1. Tylenol.  2. Ibuprofen p.r.n.  3. She did take Cymbalta for fibromyalgia but did not like the way it      made her feel.  She felt jittery and weird.   ALLERGIES:  NONE.   SOCIAL HISTORY:  She is single.  She denies use of tobacco or alcohol.  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  She has a  daughter, Alejandra Cole, who is 52 and is in 10th grade.   FAMILY HISTORY:  Her maternal grandfather died of Hilda Blades disease.  Maternal grandmother died of stroke.  Her mother has hypertension.  She  does not know the family history on her father's side of the family.   PHYSICAL EXAMINATION:  Height 5 feet 8 inches.  Weight 216 pounds.  Blood pressure 182/84.  Pulse 76.  Well-nourished, well-developed female  in no acute distress.  NECK:  Supple.  There is no lymphadenopathy.  No thyromegaly.  LUNGS:  Clear to auscultation bilaterally.  CARDIOVASCULAR:  Exam regular rate and rhythm.  ABDOMEN:  Soft, nontender, and nondistended.  There are no palpable  masses or hepatosplenomegaly.  Groins were negative for adenopathy.  EXTREMITIES:  There is no edema.  PELVIC:  External genitalia is within normal limits.  The vagina is well  epithelialized.  The cervix is visualized; it is status post cone.  There are no gross visible lesions.  Bimanual examination did reveal  some cervical motion tenderness.   The corpus is normal size, shape, and  consistency.  There are no adnexal masses.  RECTOVAGINAL:  Exam confirms no parametrial involvement.   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  I had a lengthy discussion with the patient.  We  spent over 30 minutes face-to-face time discussing the options.  We  discussed possibility of proceeding with modified radical hysterectomy  and bilateral pelvic lymph node dissection.  We had a lengthy discussion  regarding lymphedema as she is fearful of developing lymphedema.  Toward  the end of our conversation, she states that she may be interested in  having children.  I discussed with her that options in that setting  would include an aggressive cone versus a radical trachelectomy with  bilateral pelvic lymph node dissection.  At this point, she does not  have a partner or a boyfriend and it is not that she had been attempting  or considering conception it is just now that she is faced with the  decision of a possible hysterectomy she is hesitant to do so.  I  discussed with her if she is interested in fertility-sparing procedure I  would like to proceed with an MRI to see if we can better delineate the  lesion.  I did discuss with her that occasionally with adenocarcinomas  even in the setting of negative margins some people are fearful of  proceeding with radical trachelectomy as there is fear of skipped  lesions in the endocervical canal.  We had a lengthy discussion  regarding this.  At this point, she is unsure of how she would like to  proceed.  She does have my card and will contact me with any questions.  I did discuss with the patient that we would like to hear back from her  by Thanksgiving Holiday to see if we could plan for followup surgery and  treatment.  Her questions were listed and answered to her satisfaction  and  again she will take responsibility of contacting us with her  decision in the near future.  She is tentatively scheduled for surgery  on December 18th at [facility] under the care of Dr. Stark Jock, my partner.  We will need to amend that pending the patient's decisions.  [name], MD  Electronically Signed     PAG/MEDQ  D:  07/16/2007  T:  07/17/2007  Job:  [id number]

## 2011-01-09 NOTE — Op Note (Signed)
NAMEKRISTY, SCHOMBURG        ACCOUNT NO.:  [id number]   MEDICAL RECORD NO.:  [id number]          PATIENT TYPE:  AMB   LOCATION:  DAY                           FACILITY:  [facility]   PHYSICIAN:  Kassie Mends, M.D.      DATE OF BIRTH:  1970-08-28   DATE OF PROCEDURE:  10/01/2007  DATE OF DISCHARGE:                               OPERATIVE REPORT   PRIMARY PHYSICIAN:  Franchot Heidelberg, M.D.   PROCEDURE:  Colonoscopy.   INDICATION FOR EXAM:  Ms. Crook is 40 year old female who presents  with rectal bleeding.  Her hemoglobin was 12.9.  She has intermittent  loose stools and hard stools.   FINDINGS:  1. Ms. Mcfaul was a very difficult stick.  She required the      C.R.N.A. to establish IV access.  2.  Slightly tortuous sigmoid      colon.  Otherwise no polyps, masses, inflammatory changes,      diverticular AVMs, or hemorrhoids seen.   DIAGNOSIS:  No source for rectal bleeding identified.   RECOMMENDATIONS:  1. Will give Ms. Ledyard a prescription for [facility] rectal      suppositories to use twice daily for 14 days if her rectal bleeding      returns.  She does not like to take medicine.  2. She should follow high fiber diet.  She is given a handout on high-      fiber diet.  3. She may follow up with Dr. Cira Servant as needed.  4. She should minimize the use of Excedrin and use ibuprofen sparingly      for pain.   MEDICATIONS:  1. Demerol 50 mg IV.  2. Versed 6 mg IV.  3. Phenergan 12.5 mg IV.   PROCEDURE TECHNIQUE:  Physical exam was performed.  Informed consent was  obtained from the patient after explaining the benefits, risks, and  alternatives to the procedure.  The patient was connected to the monitor  and placed in the left lateral position.  Continuous oxygen was provided  by nasal cannula.  IV medicine was administered through an indwelling  cannula.  After administration of sedation and rectal exam, the  patient's rectum  was intubated, and the scope was  advanced under direct visualization to  the cecum.  The scope was removed slowly by carefully examining the  color, texture, anatomy and integrity of the mucosa on the way out.  The  patient was recovered in endoscopy and discharged home in satisfactory  condition.      Kassie Mends, M.D.  Electronically Signed     SM/MEDQ  D:  10/01/2007  T:  10/02/2007  Job:  [id number]   cc:   Franchot Heidelberg, M.D.

## 2011-03-17 ENCOUNTER — Ambulatory Visit: Payer: Self-pay | Admitting: Internal Medicine

## 2011-04-05 ENCOUNTER — Other Ambulatory Visit: Payer: Self-pay

## 2011-04-05 DIAGNOSIS — Z139 Encounter for screening, unspecified: Secondary | ICD-10-CM

## 2011-04-09 ENCOUNTER — Ambulatory Visit (HOSPITAL_COMMUNITY)
Admission: RE | Admit: 2011-04-09 | Discharge: 2011-04-09 | Disposition: A | Discharge status: Home / Self Care | Payer: Medicaid Other | Source: Ambulatory Visit | Attending: Obstetrics & Gynecology | Admitting: Obstetrics & Gynecology

## 2011-04-09 DIAGNOSIS — Z1231 Encounter for screening mammogram for malignant neoplasm of breast: Secondary | ICD-10-CM | POA: Insufficient documentation

## 2011-04-09 DIAGNOSIS — Z139 Encounter for screening, unspecified: Secondary | ICD-10-CM

## 2011-04-30 ENCOUNTER — Emergency Department: Payer: Self-pay | Admitting: Emergency Medicine

## 2011-05-17 LAB — URINALYSIS, ROUTINE W REFLEX MICROSCOPIC
Bilirubin Urine: NEGATIVE
Bilirubin Urine: NEGATIVE
Glucose, UA: NEGATIVE
Ketones, ur: NEGATIVE
Specific Gravity, Urine: 1.015
Specific Gravity, Urine: <1.005 — ABNORMAL LOW
Urobilinogen, UA: 0.2
Urobilinogen, UA: 0.2
pH: 6
pH: 6.5

## 2011-05-17 LAB — URINE MICROSCOPIC-ADD ON

## 2011-05-17 LAB — URINE CULTURE

## 2011-05-17 LAB — HEMOGLOBIN AND HEMATOCRIT, BLOOD
HCT: 37.3
Hemoglobin: 12.9

## 2011-05-22 ENCOUNTER — Ambulatory Visit: Payer: Self-pay | Admitting: Otolaryngology

## 2011-05-28 LAB — RAPID STREP SCREEN (MED CTR MEBANE ONLY): Streptococcus, Group A Screen (Direct): NEGATIVE

## 2011-05-28 LAB — STREP A DNA PROBE: Group A Strep Probe: NEGATIVE

## 2011-06-07 LAB — CBC
MCHC: 34.7
MCV: 93.7
Platelets: 225
RBC: 4.04
RDW: 12.1

## 2011-06-07 LAB — URINALYSIS, ROUTINE W REFLEX MICROSCOPIC
Bilirubin Urine: NEGATIVE
Glucose, UA: NEGATIVE
Nitrite: NEGATIVE
Specific Gravity, Urine: <1.005 — ABNORMAL LOW
pH: 6.5

## 2011-06-07 LAB — DIFFERENTIAL
Eosinophils Absolute: 0.2
Eosinophils Relative: 3
Lymphocytes Relative: 35
Lymphs Abs: 2.2
Monocytes Relative: 8

## 2011-06-07 LAB — COMPREHENSIVE METABOLIC PANEL
ALT: 18
AST: 24
CO2: 28
Calcium: 9.3
Creatinine, Ser: 0.66
GFR calc Af Amer: >60
GFR calc non Af Amer: >60
Sodium: 134 — ABNORMAL LOW
Total Protein: 7.2

## 2011-06-07 LAB — HCG, QUANTITATIVE, PREGNANCY: hCG, Beta Chain, Quant, S: <2

## 2011-06-14 LAB — HIV ANTIBODY (ROUTINE TESTING W REFLEX): HIV: NONREACTIVE

## 2011-06-14 LAB — HEPATITIS C ANTIBODY: HCV Ab: NEGATIVE

## 2011-06-20 ENCOUNTER — Ambulatory Visit: Payer: Self-pay | Admitting: Otolaryngology

## 2015-09-15 ENCOUNTER — Other Ambulatory Visit: Payer: Self-pay | Admitting: Internal Medicine

## 2015-09-15 DIAGNOSIS — M25561 Pain in right knee: Secondary | ICD-10-CM

## 2015-10-05 ENCOUNTER — Ambulatory Visit: Payer: Medicaid Other

## 2016-11-20 ENCOUNTER — Other Ambulatory Visit: Payer: Self-pay | Admitting: Otolaryngology

## 2016-11-20 DIAGNOSIS — E041 Nontoxic single thyroid nodule: Secondary | ICD-10-CM

## 2016-12-03 ENCOUNTER — Ambulatory Visit
Admission: RE | Admit: 2016-12-03 | Discharge: 2016-12-03 | Disposition: A | Discharge status: Home / Self Care | Payer: BLUE CROSS/BLUE SHIELD | Source: Ambulatory Visit | Attending: Otolaryngology | Admitting: Otolaryngology

## 2016-12-03 DIAGNOSIS — E041 Nontoxic single thyroid nodule: Secondary | ICD-10-CM | POA: Insufficient documentation

## 2017-11-10 IMAGING — US US THYROID
1 series · 13 of 25 positions shown · non-contrast
Comparison: None.

CLINICAL DATA: Prior ultrasound follow-up. Previous ultrasounds
performed in the outpatient setting.

EXAM:
THYROID ULTRASOUND
TECHNIQUE: Ultrasound examination of the thyroid gland and adjacent soft
tissues was performed.

[Series 1: us thyroid · 0.07mm/px · 13 of 56 slices shown]
[im 1/56]
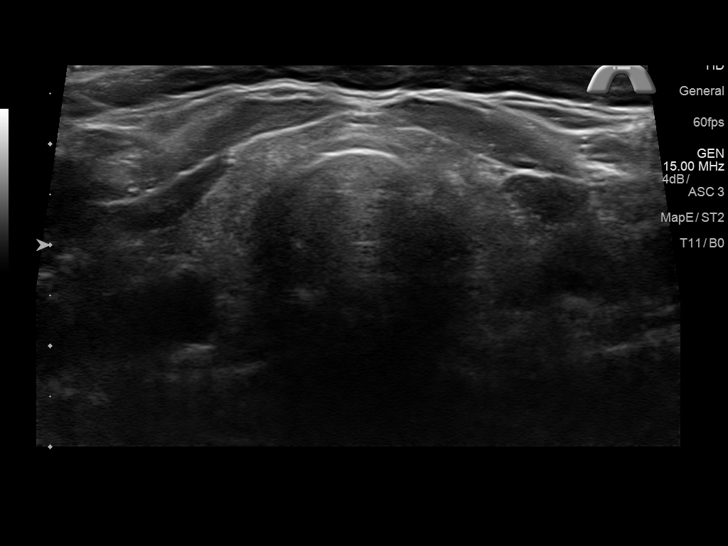
[im 5/56]
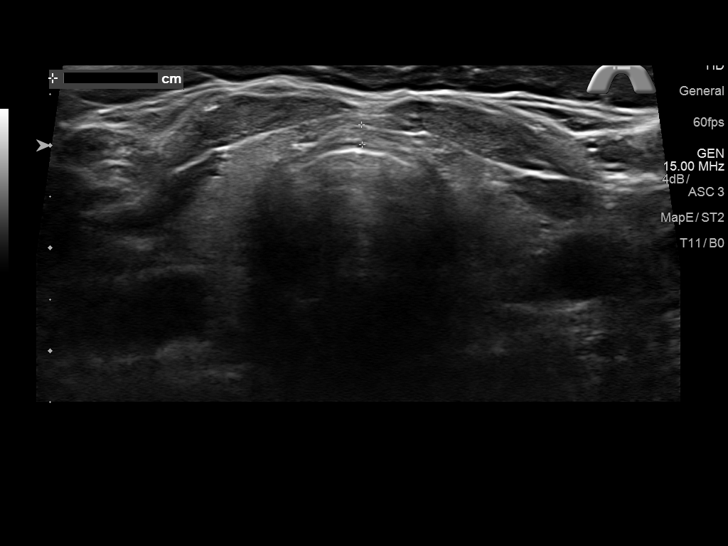
[im 10/56]
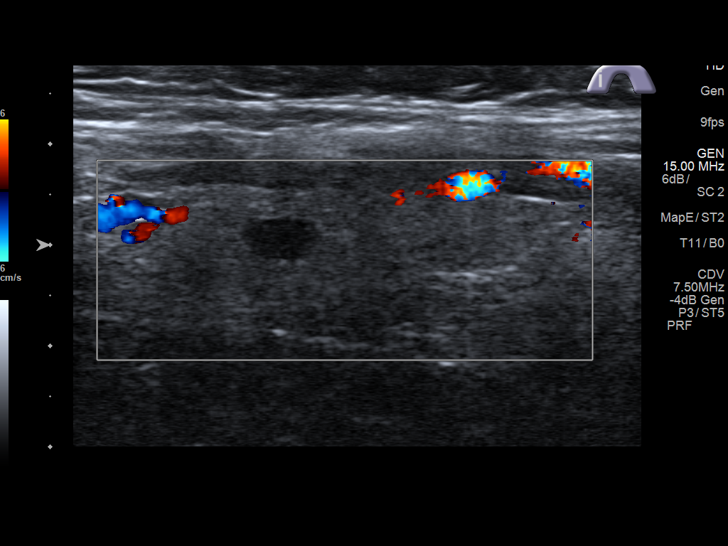
[im 14/56]
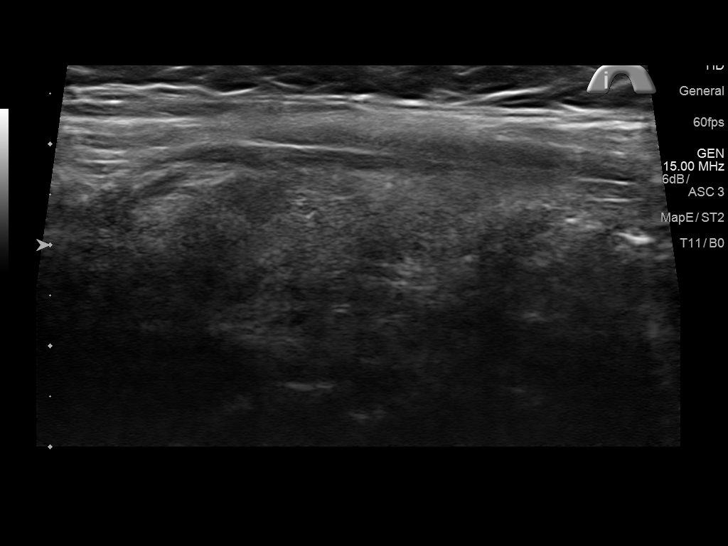
[im 19/56]
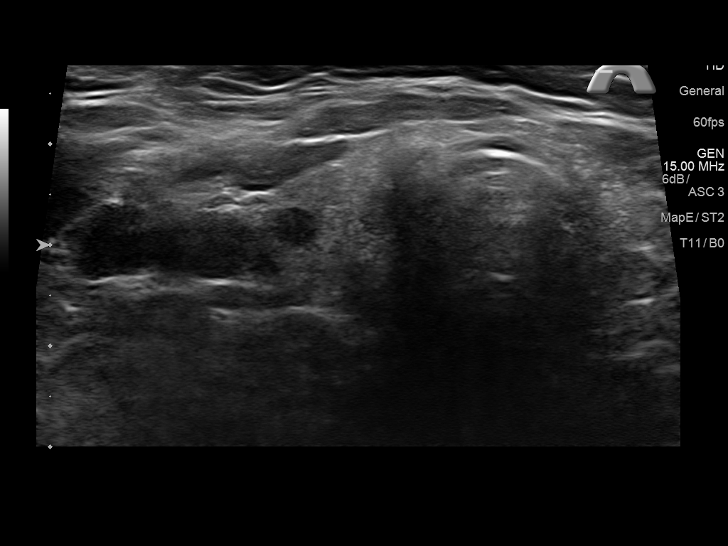
[im 23/56]
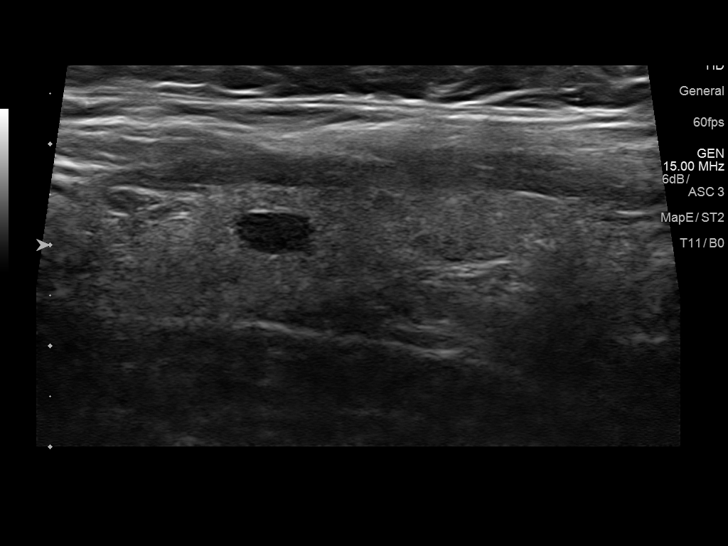
[im 28/56]
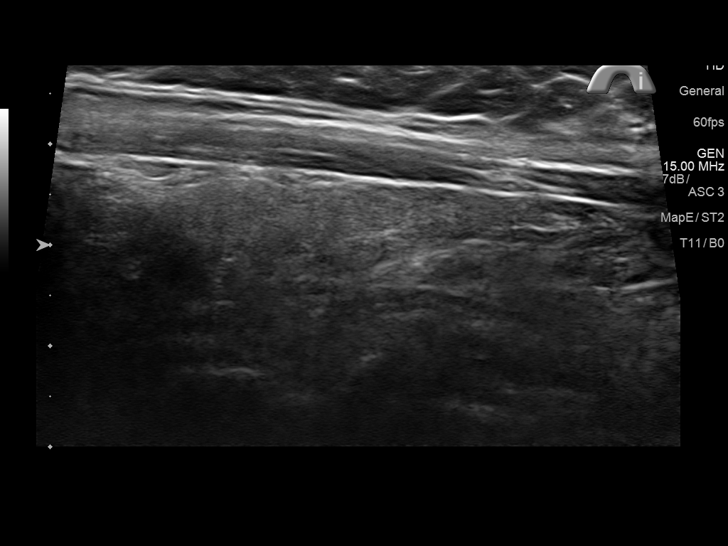
[im 33/56]
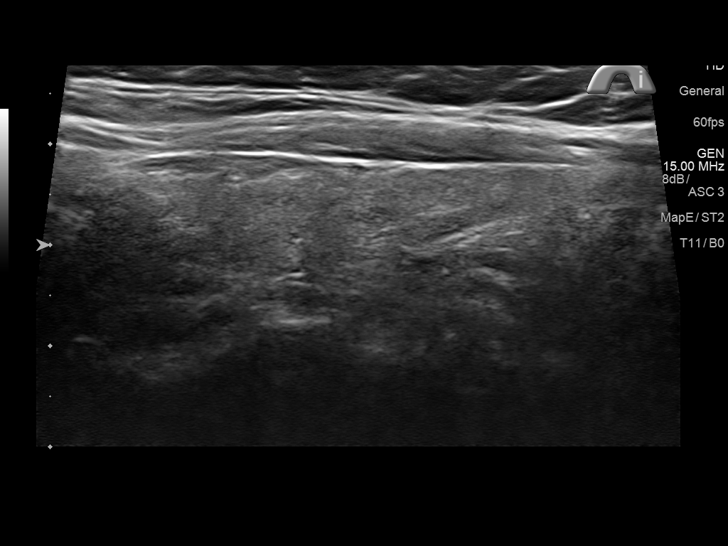
[im 37/56]
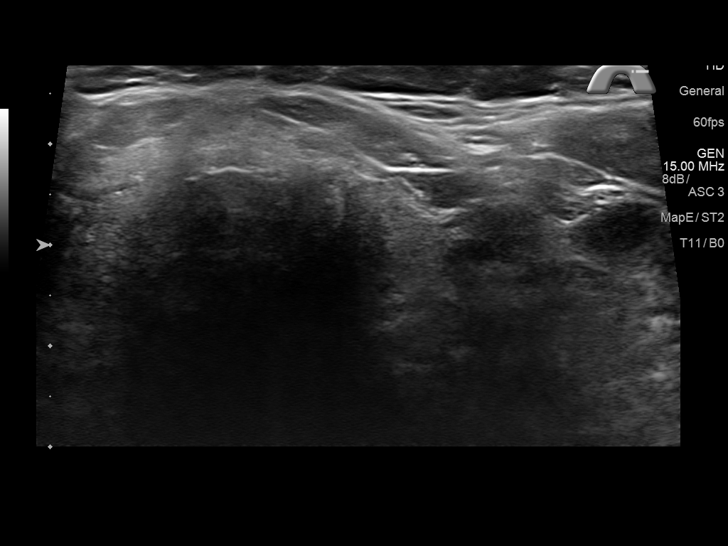
[im 42/56]
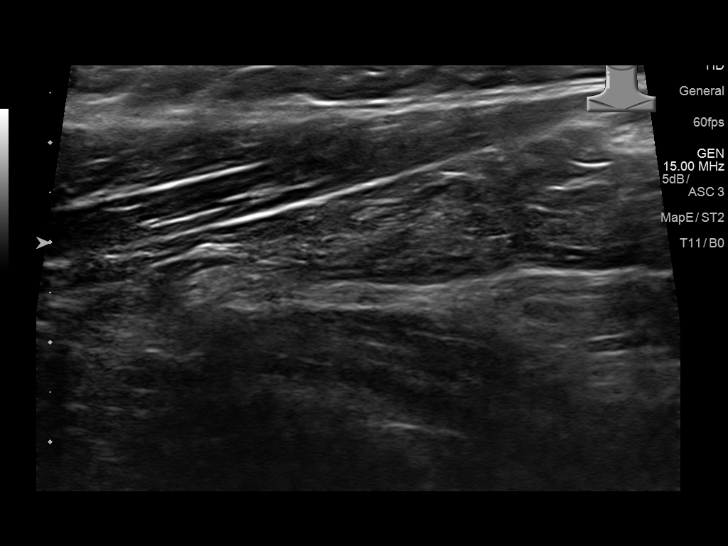
[im 46/56]
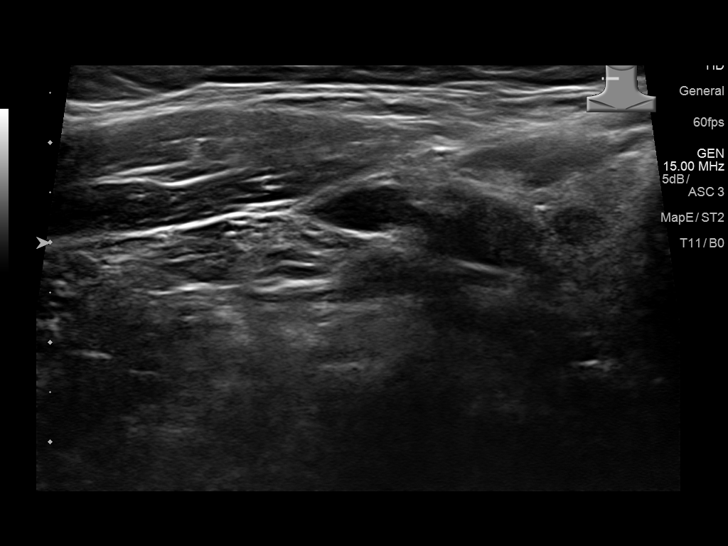
[im 51/56]
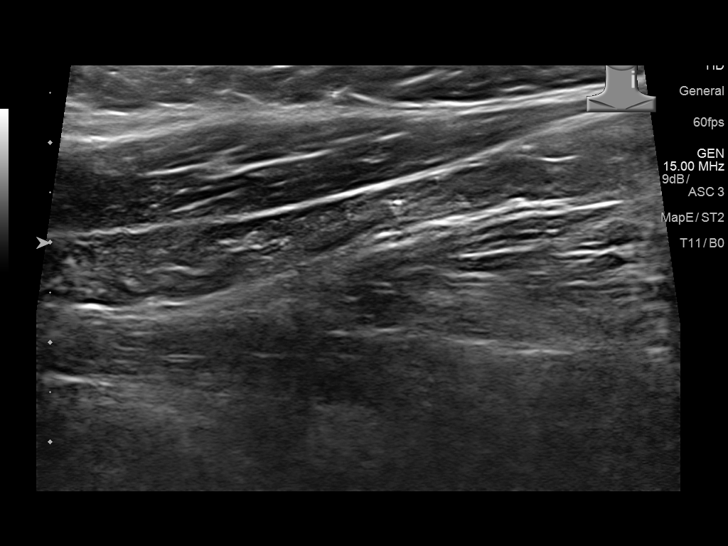
[im 56/56]
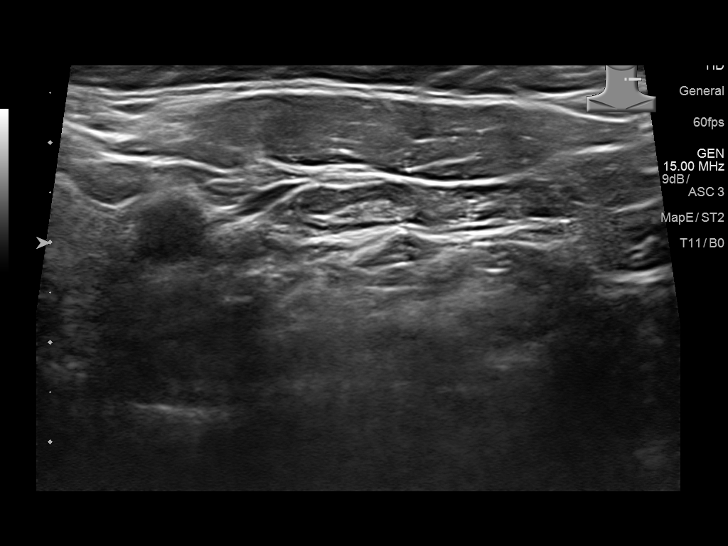

[13 of 25 positions shown; findings below may reference images not displayed]

FINDINGS: Parenchymal Echotexture: Mildly heterogenous

Isthmus: 0.2 cm

Right lobe: 3.9 x 1.2 x 0.9 cm

Left lobe: 3.4 x 0.9 x 0.9 cm

_________________________________________________________

Estimated total number of nodules >/= 1 cm: 0

Number of spongiform nodules >/=  2 cm not described below (TR1): 0

Number of mixed cystic and solid nodules >/= 1.5 cm not described
below (TR2): 0

_________________________________________________________

Nodule # 1:

Location: Right; Mid

Maximum size: 0.7 cm; Other 2 dimensions: 0.5 x 0.5 cm

Composition: solid/almost completely solid (2)

Echogenicity: very hypoechoic (3)

Shape: not taller-than-wide (0)

Margins: smooth (0)

Echogenic foci: none (0)

ACR TI-RADS total points: 5.

ACR TI-RADS risk category: TR4 (4-6 points).

ACR TI-RADS recommendations:

Given size (<0.9 cm) and appearance, this nodule does NOT meet
TI-RADS criteria for biopsy or dedicated follow-up.

_________________________________________________________

The left lobe is heterogeneous without focal nodule. subcentimeter
short axis diameter lymph nodes are noted.
IMPRESSION: Small 0.7 cm right lobe nodule does not meet criteria for follow-up
or biopsy.

The above is in keeping with the ACR TI-RADS recommendations - [HOSPITAL] 1858;[DATE].

## 2019-03-26 ENCOUNTER — Other Ambulatory Visit: Payer: Self-pay

## 2019-03-26 DIAGNOSIS — Z20822 Contact with and (suspected) exposure to covid-19: Secondary | ICD-10-CM

## 2019-03-29 LAB — NOVEL CORONAVIRUS, NAA: SARS-CoV-2, NAA: NOT DETECTED

## 2021-02-23 ENCOUNTER — Other Ambulatory Visit: Payer: Self-pay | Admitting: Internal Medicine

## 2021-02-23 DIAGNOSIS — R1012 Left upper quadrant pain: Secondary | ICD-10-CM

## 2021-02-23 DIAGNOSIS — R110 Nausea: Secondary | ICD-10-CM

## 2021-02-24 ENCOUNTER — Ambulatory Visit: Payer: BLUE CROSS/BLUE SHIELD | Attending: Internal Medicine

## 2023-02-28 ENCOUNTER — Ambulatory Visit
Admission: RE | Admit: 2023-02-28 | Discharge: 2023-02-28 | Disposition: A | Discharge status: Home / Self Care | Payer: Medicaid Other | Source: Ambulatory Visit

## 2023-02-28 VITALS — BP 137/91 | HR 93 | Temp 98.6°F | Resp 18

## 2023-02-28 DIAGNOSIS — R220 Localized swelling, mass and lump, head: Secondary | ICD-10-CM

## 2023-02-28 DIAGNOSIS — T63461A Toxic effect of venom of wasps, accidental (unintentional), initial encounter: Secondary | ICD-10-CM | POA: Diagnosis not present

## 2023-02-28 HISTORY — DX: Gastro-esophageal reflux disease without esophagitis: K21.9

## 2023-02-28 HISTORY — DX: Disorder of thyroid, unspecified: E07.9

## 2023-02-28 HISTORY — DX: Sjogren syndrome, unspecified: M35.00

## 2023-02-28 HISTORY — DX: Rheumatoid arthritis, unspecified: M06.9

## 2023-02-28 HISTORY — DX: Malignant (primary) neoplasm, unspecified: C80.1

## 2023-02-28 HISTORY — DX: Essential (primary) hypertension: I10

## 2023-02-28 HISTORY — DX: Celiac disease: K90.0

## 2023-02-28 MED ORDER — PREDNISONE 10 MG (21) PO TBPK: ORAL_TABLET | Freq: Every day | ORAL | 0 refills | Status: DC

## 2023-02-28 MED ORDER — DEXAMETHASONE SODIUM PHOSPHATE 10 MG/ML IJ SOLN
10.0000 mg | Freq: Once | INTRAMUSCULAR | Status: AC
  Administered 2023-02-28: 10 mg via INTRAMUSCULAR

## 2023-02-28 NOTE — ED Provider Notes (Signed)
Alejandra Cole    CSN: [id number] Arrival date & time: 02/28/23  4098      History   Chief Complaint Chief Complaint  Patient presents with   Insect Bite    Multiple wasp/yellow jacket bites. Swelling/ potential pus - Entered by patient    HPI Alejandra Cole is a 52 y.o. female.  Patient presents with multiple yellowjacket stings on her extremities and 1 yellowjacket sting above her left eye.  These occurred this morning while mowing.  No difficulty swallowing or breathing.  She has taken 25 mg of Benadryl every 4 hours today.  Her medical history includes rheumatoid arthritis, hypertension, celiac disease, Sjogren syndrome, GERD.  The history is provided by the patient and medical records.    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      Home Medications    Prior to Admission medications   Medication Sig Start Date End Date Taking? Authorizing Provider  emtricitabine-tenofovir (TRUVADA) 200-300 MG tablet Take 1 tablet by mouth daily. 01/06/23  Yes [provider]  predniSONE (STERAPRED UNI-PAK 21 TAB) 10 MG (21) TBPK tablet Take by mouth daily. As directed 03/01/23  Yes Mickie Bail, NP  hydrochlorothiazide (HYDRODIURIL) 25 MG tablet Take 25 mg by mouth daily.    [provider]  levothyroxine (SYNTHROID) 88 MCG tablet Take 88 mcg by mouth daily.    [provider]  losartan (COZAAR) 50 MG tablet Take 100 mg by mouth daily.    [provider]  metFORMIN (GLUCOPHAGE) 500 MG tablet Take 1,000 mg by mouth 2 (two) times daily.    [provider]  pravastatin (PRAVACHOL) 40 MG tablet Take 40 mg by mouth daily.    [provider]    Family History History reviewed. No pertinent family history.  Social History Social History   Tobacco Use   Smoking status: Never   Smokeless tobacco: Never  Substance Use Topics   Alcohol use: Yes   Drug use: Never     Allergies   Patient has no known allergies.   Review of Systems Review of Systems  Constitutional:  Negative for chills and fever.  HENT:  Positive for facial swelling. Negative for sore throat, trouble swallowing and voice change.   Respiratory:  Negative for cough and shortness of breath.   Cardiovascular:  Negative for chest pain and palpitations.  Skin:  Positive for wound. Negative for color change.  Neurological:  Negative for weakness and numbness.     Physical Exam Triage Vital Signs ED Triage Vitals  Enc Vitals Group     BP  Pulse      Resp      Temp      Temp src      SpO2      Weight      Height      Head Circumference      Peak Flow      Pain Score      Pain Loc      Pain Edu?      Excl. in GC?    No data found.  Updated Vital Signs BP (!) 137/91   Pulse 93   Temp 98.6 F (37 C)   Resp 18   SpO2 94%   Visual Acuity Right Eye Distance:   Left Eye Distance:   Bilateral Distance:    Right Eye Near:   Left Eye Near:    Bilateral Near:     Physical Exam Vitals and nursing note reviewed.  Constitutional:      General: She is not in acute distress.    Appearance: She is well-developed. She is obese.  HENT:      Mouth/Throat:     Mouth: Mucous membranes are moist.     Pharynx: Oropharynx is clear.     Comments: No oropharyngeal swelling.  No difficulty swallowing.  Speech clear. Eyes:     Pupils: Pupils are equal, round, and reactive to light.     Comments: Mild edema of left upper eyelid.  Cardiovascular:     Rate and Rhythm: Normal rate and regular rhythm.     Heart sounds: Normal heart sounds.  Pulmonary:     Effort: Pulmonary effort is normal. No respiratory distress.     Breath sounds: Normal breath sounds.  Musculoskeletal:     Cervical back: Neck supple.  Skin:    General: Skin is warm and dry.     Findings: Lesion present.     Comments: Several scattered bee stings on arms and legs.  Mild edema.  No erythema.  Neurological:     Mental Status: She is alert.      UC Treatments / Results  Labs (all labs ordered are listed, but only abnormal results are displayed) Labs Reviewed - No data to display  EKG   Radiology No results found.  Procedures Procedures (including critical care time)  Medications Ordered in UC Medications  dexamethasone (DECADRON) injection 10 mg (10 mg Intramuscular Given 02/28/23 1911)    Initial Impression / Assessment and Plan / UC Course  I have reviewed the triage vital signs and the nursing notes.  Pertinent labs & imaging results that were available during my care of the patient were reviewed by me and considered in my medical decision making (see chart for details).    Mild facial swelling, Multiple yellowjacket stings.  No difficulty swallowing or breathing.  The yellowjacket stings occurred earlier this morning while mowing the grass.  Treating with dexamethasone injection and starting prednisone taper tomorrow.  Discussed Zyrtec daily also.  911 and ED precautions discussed.  Education provided on bee stings.  Patient is verbally upset that I will not prescribe gabapentin for her bee stings; I discussed that this is not indicated.  Instructed  her to follow-up with her PCP tomorrow.  She agrees to plan of care.  Final Clinical Impressions(s) / UC Diagnoses   Final diagnoses:  Facial swelling  Yellow jacket sting, accidental or unintentional, initial encounter     Discharge Instructions      You were given an injection of a steroid  called dexamethasone.  Start the prednisone taper tomorrow as directed.    Take Benadryl or Zyrtec as directed.    Follow up with your primary care provider.   Call 911 and go to the emergency department if you have difficulty swallowing or breathing.         ED Prescriptions     Medication Sig Dispense Auth. Provider   predniSONE (STERAPRED UNI-PAK 21 TAB) 10 MG (21) TBPK tablet Take by mouth daily. As directed 21 tablet Mickie Bail, NP      PDMP not reviewed this encounter.   Mickie Bail, NP 02/28/23 [phone]

## 2023-02-28 NOTE — Discharge Instructions (Signed)
You were given an injection of a steroid called dexamethasone.  Start the prednisone taper tomorrow as directed.    Take Benadryl or Zyrtec as directed.    Follow up with your primary care provider.   Call 911 and go to the emergency department if you have difficulty swallowing or breathing.

## 2023-02-28 NOTE — ED Triage Notes (Signed)
Patient to Urgent Care with complaints of multiple yellow jacket/ wasp stings present to various areas of her body including bilateral legs/ ankles/ left wrist/ face. Areas sting and burn.  Occurred today. Concerned about infection.   Has taken 3-4 doses of benadryl today.

## 2024-02-28 ENCOUNTER — Ambulatory Visit
Admission: EM | Admit: 2024-02-28 | Discharge: 2024-02-28 | Disposition: A | Discharge status: Home / Self Care | Attending: Family Medicine | Admitting: Family Medicine

## 2024-02-28 DIAGNOSIS — R21 Rash and other nonspecific skin eruption: Secondary | ICD-10-CM

## 2024-02-28 MED ORDER — HYDROXYZINE HCL 25 MG PO TABS: 25.0000 mg | ORAL_TABLET | Freq: Three times a day (TID) | ORAL | 0 refills | Status: AC | PRN

## 2024-02-28 MED ORDER — PREDNISONE 10 MG PO TABS: ORAL_TABLET | ORAL | 0 refills | Status: AC

## 2024-02-28 MED ORDER — FAMOTIDINE 20 MG PO TABS: 20.0000 mg | ORAL_TABLET | Freq: Two times a day (BID) | ORAL | 0 refills | Status: AC

## 2024-02-28 NOTE — ED Triage Notes (Addendum)
 Patient presenting with ras on the leg onset a few months ago. States the rash has now progressed to the back, chest, and throat with there being a tickle I the back of the throat onset this past Wednesday night.  Patient states the newer rashes started when she increased her Wegovy dose this past week.  Prescriptions or OTC medications tried: Yes- prednisone , triamcinolone topical    with little relief

## 2024-02-28 NOTE — Discharge Instructions (Signed)
 Stop the [facility].  Medications as directed.  Dermatology recommendation:  [facility] Address: [address] [place], [place] [address] Phone: [phone]

## 2024-02-28 NOTE — ED Provider Notes (Signed)
 [facility]    CSN: [id number] Arrival date & time: 02/28/24  1215      History   Chief Complaint Chief Complaint  Patient presents with   Rash    HPI  53 year old female presents for evaluation of rash.  Patient initially developed rash in May.  It affected the right lower extremity.  [facility] on [facility]one  [facility].  She states that she continues to have a raised erythematous rash to the right anterior leg.  Since Wednesday she has  developed rash to the low back as well as the chest and right side of the neck.  She states that she has a tickle in her throat.  She is concerned about allergic response/intolerance to [facility].  Georjean was recently started and dose has been increasing.   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      Home Medications    Prior to Admission medications   Medication Sig Start Date End Date Taking? Authorizing Provider  Coenzyme Q10 100 MG capsule Take 100 mg by mouth.   Yes [provider]  docusate sodium (COLACE) 50 MG capsule Take by mouth.   Yes [provider]  famotidine  (PEPCID ) 20 MG tablet Take 1 tablet (20 mg total) by mouth 2 (two) times daily. 02/28/24  Yes Shakura Cowing G, DO  hydrochlorothiazide (HYDRODIURIL) 25 MG tablet Take 25 mg by mouth daily.   Yes [provider]  hydrOXYzine  (ATARAX ) 25 MG tablet Take 1  tablet (25 mg total) by mouth every 8 (eight) hours as needed for itching. 02/28/24  Yes Aimee Timmons G, DO  levothyroxine (SYNTHROID) 88 MCG tablet Take 88 mcg by mouth daily.   Yes [provider]  losartan (COZAAR) 50 MG tablet Take 100 mg by mouth daily.   Yes [provider]  pravastatin (PRAVACHOL) 40 MG tablet Take 40 mg by mouth daily.   Yes [provider]  predniSONE  (DELTASONE ) 10 MG tablet 50 mg daily x 2 days, then 40 mg daily x 2 days, then 30 mg daily x 2 days, then 20 mg daily x 2 days, then 10 mg daily x 2 days. 02/28/24  Yes Angelli Baruch G, DO  Semaglutide-Weight Management 2.4 MG/0.75ML SOAJ Inject 2.4 mg into the skin once a week. 02/19/24 03/20/24 Yes [provider]  triamcinolone cream (KENALOG) 0.1 % Apply topically daily. 12/09/23  Yes [provider]    Family History No family history on file.  Social History Social History   Tobacco Use   Smoking status: Never   Smokeless tobacco: Never  Substance Use Topics   Alcohol use: Yes   Drug use: Never     Allergies   Patient has no known allergies.   Review of Systems Review of Systems  Skin:  Positive for rash.     Physical Exam Triage Vital Signs ED Triage  Vitals  Encounter Vitals Group     BP 02/28/24 1236 122/79     Girls Systolic BP Percentile --      Girls Diastolic BP Percentile --      Boys Systolic BP Percentile --      Boys Diastolic BP Percentile --      Pulse Rate 02/28/24 1236 63     Resp 02/28/24 1236 18     Temp 02/28/24 1236 98.3 F (36.8 C)     Temp Source 02/28/24 1236 Oral     SpO2 02/28/24 1236 95 %     Weight 02/28/24 1235 205 lb (93 kg)     Height 02/28/24 1235 5' 8 (1.727 m)     Head Circumference --      Peak Flow --      Pain Score 02/28/24 1232 3     Pain Loc --      Pain Education --      Exclude from Growth Chart --    No data found.  Updated Vital Signs BP 122/79 (BP Location: Right Arm)   Pulse 63   Temp 98.3 F (36.8  C) (Oral)   Resp 18   Ht 5' 8 (1.727 m)   Wt 93 kg   SpO2 95%   BMI 31.17 kg/m   Visual Acuity Right Eye Distance:   Left Eye Distance:   Bilateral Distance:    Right Eye Near:   Left Eye Near:    Bilateral Near:     Physical Exam Vitals and nursing note reviewed.  Constitutional:      General: She is not in acute distress.    Appearance: Normal appearance.  HENT:     Head: Normocephalic and atraumatic.  Pulmonary:     Effort: Pulmonary effort is normal.  Skin:    Comments: Erythematous papular rash to the low back, right superior chest and right side of the neck.  Also has rash to the right anterior lower leg.  Neurological:     Mental Status: She is alert.      UC Treatments / Results  Labs (all labs ordered are listed, but only abnormal results are displayed) Labs Reviewed - No data to display  EKG   Radiology No results found.  Procedures Procedures (including critical care time)  Medications Ordered in UC Medications - No data to display  Initial Impression / Assessment and Plan / UC Course  I have reviewed the triage vital signs and the nursing notes.  Pertinent labs & imaging results that were available during my care of the patient were reviewed by me and considered in my medical decision making (see chart for details).    53 year old female presents with rash.  Could be related to [facility].  Advised to hold.  Placing on prednisone , Atarax , and Pepcid .  Information given regarding [facility] dermatology.  Final Clinical Impressions(s) / UC Diagnoses   Final diagnoses:  Rash     Discharge Instructions      Stop the [facility].  Medications as directed.  Dermatology recommendation:  [facility] Address: [address] [address] [place], [place] [address] Phone: [phone]     ED Prescriptions     Medication Sig Dispense Auth. Provider   predniSONE  (DELTASONE ) 10 MG tablet 50 mg daily x 2 days, then 40 mg daily x 2 days, then 30 mg  daily x 2 days, then 20 mg daily x 2 days, then 10 mg daily x 2 days. 30 tablet [employer],  Emiyah Spraggins G, DO   hydrOXYzine  (ATARAX ) 25 MG tablet Take 1 tablet (25 mg total) by mouth every 8 (eight) hours as needed for itching. 30 tablet Dujuan Stankowski G, DO   famotidine  (PEPCID ) 20 MG tablet Take 1 tablet (20 mg total) by mouth 2 (two) times daily. 30 tablet Shila Kruczek G, DO      PDMP not reviewed this encounter.   Korrine Sicard G, [place] 02/28/24 1318

## 2024-03-06 ENCOUNTER — Other Ambulatory Visit: Payer: Self-pay | Admitting: Family Medicine
